# Patient Record
Sex: Female | Born: 1990 | Race: White | Hispanic: No | Marital: Married | Smoking: Never smoker
Health system: Southern US, Community
[De-identification: ages and names within clinical notes are randomized; demographics above are authoritative.]

## PROBLEM LIST (undated history)

## (undated) DIAGNOSIS — F329 Major depressive disorder, single episode, unspecified: Secondary | ICD-10-CM

## (undated) DIAGNOSIS — F32A Depression, unspecified: Secondary | ICD-10-CM

## (undated) DIAGNOSIS — J45909 Unspecified asthma, uncomplicated: Secondary | ICD-10-CM

## (undated) DIAGNOSIS — Z8719 Personal history of other diseases of the digestive system: Secondary | ICD-10-CM

## (undated) DIAGNOSIS — F419 Anxiety disorder, unspecified: Secondary | ICD-10-CM

## (undated) HISTORY — DX: Unspecified asthma, uncomplicated: J45.909

## (undated) HISTORY — PX: DIAGNOSTIC LAPAROSCOPY: SUR761

## (undated) HISTORY — PX: GANGLION CYST EXCISION: SHX1691

---
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
## 2014-10-21 ENCOUNTER — Encounter (HOSPITAL_COMMUNITY): Payer: Self-pay

## 2014-10-21 ENCOUNTER — Emergency Department (HOSPITAL_COMMUNITY)
Admission: EM | Admit: 2014-10-21 | Discharge: 2014-10-21 | Disposition: A | Discharge status: Home / Self Care | Payer: No Typology Code available for payment source | Attending: Emergency Medicine | Admitting: Emergency Medicine

## 2014-10-21 ENCOUNTER — Emergency Department (HOSPITAL_COMMUNITY): Payer: No Typology Code available for payment source

## 2014-10-21 DIAGNOSIS — S199XXA Unspecified injury of neck, initial encounter: Secondary | ICD-10-CM | POA: Insufficient documentation

## 2014-10-21 DIAGNOSIS — S32058A Other fracture of fifth lumbar vertebra, initial encounter for closed fracture: Secondary | ICD-10-CM | POA: Insufficient documentation

## 2014-10-21 DIAGNOSIS — S3992XA Unspecified injury of lower back, initial encounter: Secondary | ICD-10-CM | POA: Diagnosis present

## 2014-10-21 DIAGNOSIS — S32009A Unspecified fracture of unspecified lumbar vertebra, initial encounter for closed fracture: Secondary | ICD-10-CM

## 2014-10-21 DIAGNOSIS — S32018A Other fracture of first lumbar vertebra, initial encounter for closed fracture: Secondary | ICD-10-CM | POA: Insufficient documentation

## 2014-10-21 DIAGNOSIS — S32048A Other fracture of fourth lumbar vertebra, initial encounter for closed fracture: Secondary | ICD-10-CM | POA: Insufficient documentation

## 2014-10-21 DIAGNOSIS — Y9389 Activity, other specified: Secondary | ICD-10-CM | POA: Diagnosis not present

## 2014-10-21 DIAGNOSIS — Y92410 Unspecified street and highway as the place of occurrence of the external cause: Secondary | ICD-10-CM | POA: Diagnosis not present

## 2014-10-21 DIAGNOSIS — S0990XA Unspecified injury of head, initial encounter: Secondary | ICD-10-CM | POA: Insufficient documentation

## 2014-10-21 DIAGNOSIS — S32038A Other fracture of third lumbar vertebra, initial encounter for closed fracture: Secondary | ICD-10-CM | POA: Diagnosis not present

## 2014-10-21 DIAGNOSIS — Y998 Other external cause status: Secondary | ICD-10-CM | POA: Diagnosis not present

## 2014-10-21 LAB — POC URINE PREG, ED: PREG TEST UR: NEGATIVE

## 2014-10-21 MED ORDER — CYCLOBENZAPRINE HCL 10 MG PO TABS: 10.0000 mg | ORAL_TABLET | Freq: Three times a day (TID) | ORAL | Status: DC | PRN

## 2014-10-21 MED ORDER — TRAMADOL HCL 50 MG PO TABS: 50.0000 mg | ORAL_TABLET | Freq: Four times a day (QID) | ORAL | Status: DC | PRN
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
## 2014-10-21 NOTE — Discharge Instructions (Signed)
Transverse Process Fracture °A fracture of a bone is the same as a break in the bone. A fracture of a transverse process is a break of a part of one of the bones in the spine. This part extends out from the side of the main body of the bone (called the vertebral body). A transverse process is shaped like a wing. They extend from both the left and right sides of the vertebral body. °Many of these injuries occur in the thoracic spine (the upper and middle parts of the back) and the lumbar spine (the low back area). In the elderly, these injuries can also occur in the lower neck area and are affected by age-related arthritis problems and osteoporosis (thinning of bone). °It takes a lot of force to cause this type of fracture. Because other organs and so many other parts of the spine are close to the transverse processes, these fractures usually occur at the same time as injuries to: °· Other bones. °· Organs. °· Possibly the spinal cord. °Even if there is only a break to one transverse process, your caregiver will take steps to make sure that a nearby organ has not also been injured.  °CAUSES  °Most of these injuries occur as a result of a variety of accidents such as: °· Falls. °· Motor vehicle accidents. °· Recreational activities. °· A smaller number occur due to: °¨ Industrial, agricultural, and aviation accidents. °¨ Gunshot wounds and direct blows to the back. °¨ Parachuting incidents. °SYMPTOMS  °Patients with transverse process fractures have severe pain even if the actual break is small or limited and there is no injury to nearby bones, organs, or the spinal cord. More severe or complex injuries involving other bones and/or organs may include: °· Deformity of the back bones. °· Swelling/bruising over the injured area. °· Limited ability to move the affected area. °· There may be injury to a nearby: °¨ Lung. °¨ Kidney. °¨ Spleen. °¨ Liver. °Injury to nearby nerves can cause partial or complete loss of function  of the bladder and/or bowels. More severe injuries can also cause: °· Loss of sensation and/or strength in the arms/hands (if the break is in the lower part of the neck), legs, feet, and toes. °· Spinal cord injury that leads to paralysis. °DIAGNOSIS °In most cases, a broken bone will be suspected by what happened just prior to the onset of back and/or neck pain. X-rays and special imaging (CT scan and MRI imaging) are used to confirm the diagnosis as well as finding out the type and severity of the break or breaks. These tests are important for guiding and planning treatment. °There are times when special imaging cannot be done. MRI cannot be done if there is an implanted metallic device (such as a pacemaker). In these cases, other tests and imaging are done. °Other tests may be done if your caregiver is concerned about injuries to internal organs near the break of the transverse process. For example, an ultrasound may be ordered to examine the liver, spleen, or kidneys. °If there has been nerve damage near the break, additional tests may be ordered in order to find out: °· Exactly how much damage has occurred. °· To plan for what can be done to help. °These include: °· Tests of nerve function through muscles (nerve conduction studies and electromyography). °· Tests of bladder function (urodynamics). °· Tests that focus on defining specific nerve problems before surgery and what improvement has come about after surgery (evoked potentials). °TREATMENT °  If the injury is limited to a break of a transverse process with no other bone or organ injury, hospital care may not be necessary. Medication for pain control, special back bracing, and limitations in activity are done first followed by physical therapy later. °Complex breaks, multiple fractures of the spine, or unstable injuries can damage the spinal cord and may require an operation to remove pressure from the nerves and/or spinal cord and to stabilize the broken  pieces of bone. Specialty care may be needed if there has been injury to an internal organ near a broken transverse process. Each individual set of injuries is unique, and your caregiver will review many things when planning the best approach that will give the highest likelihood of a good outcome.  °HOME CARE INSTRUCTIONS °There is pain and stiffness in the back for weeks after a transverse process fracture. Bed rest, pain medicine, and a slow return to activity are generally recommended. Neck and back braces may be helpful in reducing pain and increasing mobility. When your pain allows, simple walking will help to begin the process of returning to normal activities. Exercises to improve motion and to strengthen the back may also be useful after the initial pain goes away. This will be guided by your caregiver and the team (nurses, physical therapists, occupational therapists, etc.) involved with your ongoing care. For the elderly, treatment for osteoporosis may be essential to help reduce your risk of fractures in the future. °Arrange for follow-up care as recommended to assure proper long-term care and prevention of further spine injury. It is important that you participate in your return to good health. The failure to follow up as recommended with your caregiver, orthopedic referral or other provider may result in the bone not healing properly with possible chronic disability or pain. °SEEK MEDICAL CARE IF: °· Pain is not effectively controlled with medication. °· You feel unable to decrease pain medication over time as planned. °· Activity level is not improving as planned and/or expected. °SEEK IMMEDIATE MEDICAL CARE IF: °· You have increasing pain, vomiting, or are unable to move around at all. °· You have numbness, tingling, weakness, or paralysis of any part of your body. °· You have loss of normal bowel or bladder control. °· You have difficulty breathing, cough, fever, chest or abdominal pain. °MAKE SURE  YOU:  °· Understand these instructions. °· Will watch your condition. °· Will get help right away if you are not doing well or get worse. °Document Released: 04/26/2006 Document Revised: 05/26/2013 Document Reviewed: 12/25/2006 °[employer]® Patient Information ©2015 [employer]. This information is not intended to replace advice given to you by your health care provider. Make sure you discuss any questions you have with your health care provider. ° °

## 2016-11-09 LAB — OB RESULTS CONSOLE GBS: STREP GROUP B AG: POSITIVE

## 2016-11-09 LAB — OB RESULTS CONSOLE RUBELLA ANTIBODY, IGM: Rubella: IMMUNE

## 2016-11-09 LAB — OB RESULTS CONSOLE GC/CHLAMYDIA
CHLAMYDIA, DNA PROBE: NEGATIVE
Gonorrhea: NEGATIVE

## 2016-11-09 LAB — OB RESULTS CONSOLE ABO/RH: RH TYPE: POSITIVE

## 2016-11-09 LAB — OB RESULTS CONSOLE RPR: RPR: NONREACTIVE

## 2016-11-09 LAB — OB RESULTS CONSOLE HEPATITIS B SURFACE ANTIGEN: HEP B S AG: NEGATIVE

## 2016-11-09 LAB — OB RESULTS CONSOLE ANTIBODY SCREEN: Antibody Screen: NEGATIVE

## 2016-11-09 LAB — OB RESULTS CONSOLE HIV ANTIBODY (ROUTINE TESTING): HIV: NONREACTIVE

## 2017-01-23 NOTE — L&D Delivery Note (Signed)
Delivery Note  SVD viable female Apgars 9,9 over 2nd degree ML lac.  Placenta delivered spontaneously intact with 3VC. Repair with 2-0 Chromic with good support and hemostasis noted.  R/V exam confirms.  PH art was sent.   Mother and baby to couplet care and are doing well.  EBL 400cc  Candice Camp, MD

## 2017-06-07 ENCOUNTER — Telehealth (HOSPITAL_COMMUNITY): Payer: Self-pay | Admitting: *Deleted

## 2017-06-07 ENCOUNTER — Encounter (HOSPITAL_COMMUNITY): Payer: Self-pay | Admitting: *Deleted

## 2017-06-07 NOTE — Telephone Encounter (Signed)
Preadmission screen  

## 2017-06-19 ENCOUNTER — Other Ambulatory Visit: Payer: Self-pay

## 2017-06-19 ENCOUNTER — Inpatient Hospital Stay (HOSPITAL_COMMUNITY)
Admission: AD | Admit: 2017-06-19 | Discharge: 2017-06-21 | DRG: 807 | Disposition: A | Discharge status: Home / Self Care | Payer: BLUE CROSS/BLUE SHIELD | Source: Ambulatory Visit | Attending: Obstetrics and Gynecology | Admitting: Obstetrics and Gynecology

## 2017-06-19 ENCOUNTER — Encounter (HOSPITAL_COMMUNITY): Payer: Self-pay

## 2017-06-19 DIAGNOSIS — O99824 Streptococcus B carrier state complicating childbirth: Principal | ICD-10-CM | POA: Diagnosis present

## 2017-06-19 DIAGNOSIS — Z3A39 39 weeks gestation of pregnancy: Secondary | ICD-10-CM

## 2017-06-19 DIAGNOSIS — O99214 Obesity complicating childbirth: Secondary | ICD-10-CM | POA: Diagnosis present

## 2017-06-19 MED ORDER — LACTATED RINGERS IV SOLN
INTRAVENOUS | Status: DC
  Administered 2017-06-19 – 2017-06-20 (×2): via INTRAVENOUS

## 2017-06-19 MED ORDER — LACTATED RINGERS IV SOLN: 500.0000 mL | INTRAVENOUS | Status: DC | PRN

## 2017-06-19 NOTE — H&P (Signed)
Morgan Olson is a 27 y.o. female G1 @ 39+5 wks presenting for SOL.  Pt reports increase in contractions today.  No lof or vb.  Pregnancy uncomplicated.   OB History    Gravida  1   Para      Term      Preterm      AB      Living        SAB      TAB      Ectopic      Multiple      Live Births             Past Medical History:  Diagnosis Date  . Asthma    Past Surgical History:  Procedure Laterality Date  . GANGLION CYST EXCISION     Family History: family history includes Cancer in her maternal aunt and maternal grandmother; Diabetes in her maternal aunt and maternal grandmother; Heart disease in her maternal grandmother; Hypertension in her paternal grandmother. Social History:  reports that she has never smoked. She has never used smokeless tobacco. She reports that she does not drink alcohol or use drugs.     Maternal Diabetes: No Genetic Screening: Declined Maternal Ultrasounds/Referrals: Normal Fetal Ultrasounds or other Referrals:  None Maternal Substance Abuse:  No Significant Maternal Medications:  None Significant Maternal Lab Results:  None Other Comments:  None  ROS History Dilation: 4 Effacement (%): 90 Station: -3 Exam by:: Morgan Resides, RN [phone] Blood pressure 135/81, pulse 95, temperature 98.3 F (36.8 C), temperature source Oral, resp. rate (!) 22, weight 295 lb 0.1 oz (133.8 kg), SpO2 98 %. Exam Physical Exam  Gen - uncomfortable w/ contractions Abd - gravid, NT Ext - NT Cvx 4cm, changed from 1.5cm in office Prenatal labs: ABO, Rh: A/Positive/-- (10/18 0000) Antibody: Negative (10/18 0000) Rubella: Immune (10/18 0000) RPR: Nonreactive (10/18 0000)  HBsAg: Negative (10/18 0000)  HIV: Non-reactive (10/18 0000)  GBS: Positive (10/18 0000)   Assessment/Plan: Admit PCN Epidural prn   Morgan Olson 06/19/2017, 11:50 PM

## 2017-06-19 NOTE — MAU Note (Signed)
Pt states that she started having ctx's at 0900. She states that they have worsened throughout the day and are closer together.   Reports good fetal movement.  Denies vaginal bleeding, Denies LOF

## 2017-06-20 ENCOUNTER — Inpatient Hospital Stay (HOSPITAL_COMMUNITY): Payer: BLUE CROSS/BLUE SHIELD | Admitting: Anesthesiology

## 2017-06-20 ENCOUNTER — Other Ambulatory Visit: Payer: Self-pay

## 2017-06-20 ENCOUNTER — Encounter (HOSPITAL_COMMUNITY): Payer: Self-pay | Admitting: Emergency Medicine

## 2017-06-20 DIAGNOSIS — Z3483 Encounter for supervision of other normal pregnancy, third trimester: Secondary | ICD-10-CM | POA: Diagnosis present

## 2017-06-20 DIAGNOSIS — Z3A39 39 weeks gestation of pregnancy: Secondary | ICD-10-CM | POA: Diagnosis not present

## 2017-06-20 DIAGNOSIS — O99824 Streptococcus B carrier state complicating childbirth: Secondary | ICD-10-CM | POA: Diagnosis present

## 2017-06-20 DIAGNOSIS — O99214 Obesity complicating childbirth: Secondary | ICD-10-CM | POA: Diagnosis present

## 2017-06-20 LAB — CBC
HEMATOCRIT: 39.7 % (ref 36.0–46.0)
HEMOGLOBIN: 13.3 g/dL (ref 12.0–15.0)
MCH: 29.2 pg (ref 26.0–34.0)
MCHC: 33.5 g/dL (ref 30.0–36.0)
MCV: 87.3 fL (ref 78.0–100.0)
Platelets: 202 10*3/uL (ref 150–400)
RBC: 4.55 MIL/uL (ref 3.87–5.11)
RDW: 14.2 % (ref 11.5–15.5)
WBC: 11.7 10*3/uL — AB (ref 4.0–10.5)

## 2017-06-20 LAB — ABO/RH: ABO/RH(D): A POS

## 2017-06-20 LAB — TYPE AND SCREEN
ABO/RH(D): A POS
ANTIBODY SCREEN: NEGATIVE

## 2017-06-20 LAB — RPR: RPR Ser Ql: NONREACTIVE

## 2017-06-20 MED ORDER — OXYCODONE-ACETAMINOPHEN 5-325 MG PO TABS: 1.0000 | ORAL_TABLET | ORAL | Status: DC | PRN

## 2017-06-20 MED ORDER — MEASLES, MUMPS & RUBELLA VAC ~~LOC~~ INJ
0.5000 mL | INJECTION | Freq: Once | SUBCUTANEOUS | Status: DC
  Filled 2017-06-20: qty 0.5

## 2017-06-20 MED ORDER — DIPHENHYDRAMINE HCL 25 MG PO CAPS
25.0000 mg | ORAL_CAPSULE | Freq: Four times a day (QID) | ORAL | Status: DC | PRN
Start: 2017-06-20 — End: 2017-06-21

## 2017-06-20 MED ORDER — ONDANSETRON HCL 4 MG/2ML IJ SOLN: 4.0000 mg | INTRAMUSCULAR | Status: DC | PRN

## 2017-06-20 MED ORDER — DIPHENHYDRAMINE HCL 50 MG/ML IJ SOLN: 12.5000 mg | INTRAMUSCULAR | Status: DC | PRN

## 2017-06-20 MED ORDER — PHENYLEPHRINE 40 MCG/ML (10ML) SYRINGE FOR IV PUSH (FOR BLOOD PRESSURE SUPPORT)
80.0000 ug | PREFILLED_SYRINGE | INTRAVENOUS | Status: DC | PRN
  Filled 2017-06-20: qty 10
  Filled 2017-06-20: qty 5

## 2017-06-20 MED ORDER — OXYCODONE-ACETAMINOPHEN 5-325 MG PO TABS: 2.0000 | ORAL_TABLET | ORAL | Status: DC | PRN

## 2017-06-20 MED ORDER — PENICILLIN G POT IN DEXTROSE 60000 UNIT/ML IV SOLN
3.0000 10*6.[IU] | INTRAVENOUS | Status: DC
  Administered 2017-06-20: 3 10*6.[IU] via INTRAVENOUS
  Filled 2017-06-20 (×3): qty 50

## 2017-06-20 MED ORDER — WITCH HAZEL-GLYCERIN EX PADS: 1.0000 "application " | MEDICATED_PAD | CUTANEOUS | Status: DC | PRN

## 2017-06-20 MED ORDER — LIDOCAINE HCL (PF) 1 % IJ SOLN
INTRAMUSCULAR | Status: DC | PRN
  Administered 2017-06-20: 8 mL

## 2017-06-20 MED ORDER — LACTATED RINGERS IV SOLN
500.0000 mL | Freq: Once | INTRAVENOUS | Status: AC
  Administered 2017-06-20: 500 mL via INTRAVENOUS

## 2017-06-20 MED ORDER — SOD CITRATE-CITRIC ACID 500-334 MG/5ML PO SOLN: 30.0000 mL | ORAL | Status: DC | PRN

## 2017-06-20 MED ORDER — ACETAMINOPHEN 325 MG PO TABS: 650.0000 mg | ORAL_TABLET | ORAL | Status: DC | PRN

## 2017-06-20 MED ORDER — EPHEDRINE 5 MG/ML INJ
10.0000 mg | INTRAVENOUS | Status: DC | PRN
  Filled 2017-06-20: qty 2

## 2017-06-20 MED ORDER — MEDROXYPROGESTERONE ACETATE 150 MG/ML IM SUSP: 150.0000 mg | INTRAMUSCULAR | Status: DC | PRN

## 2017-06-20 MED ORDER — COCONUT OIL OIL: 1.0000 "application " | TOPICAL_OIL | Status: DC | PRN

## 2017-06-20 MED ORDER — IBUPROFEN 600 MG PO TABS
600.0000 mg | ORAL_TABLET | Freq: Four times a day (QID) | ORAL | Status: DC
  Administered 2017-06-20 – 2017-06-21 (×5): 600 mg via ORAL
  Filled 2017-06-20 (×4): qty 1

## 2017-06-20 MED ORDER — DIBUCAINE 1 % RE OINT: 1.0000 "application " | TOPICAL_OINTMENT | RECTAL | Status: DC | PRN

## 2017-06-20 MED ORDER — ONDANSETRON HCL 4 MG PO TABS: 4.0000 mg | ORAL_TABLET | ORAL | Status: DC | PRN

## 2017-06-20 MED ORDER — SENNOSIDES-DOCUSATE SODIUM 8.6-50 MG PO TABS
2.0000 | ORAL_TABLET | ORAL | Status: DC
  Administered 2017-06-20: 2 via ORAL
  Filled 2017-06-20: qty 2

## 2017-06-20 MED ORDER — SODIUM CHLORIDE 0.9 % IV SOLN
5.0000 10*6.[IU] | Freq: Once | INTRAVENOUS | Status: AC
  Administered 2017-06-20: 5 10*6.[IU] via INTRAVENOUS
  Filled 2017-06-20: qty 5

## 2017-06-20 MED ORDER — OXYTOCIN BOLUS FROM INFUSION
500.0000 mL | Freq: Once | INTRAVENOUS | Status: AC
  Administered 2017-06-20: 500 mL via INTRAVENOUS

## 2017-06-20 MED ORDER — LIDOCAINE HCL (PF) 1 % IJ SOLN
30.0000 mL | INTRAMUSCULAR | Status: DC | PRN
  Filled 2017-06-20: qty 30

## 2017-06-20 MED ORDER — FENTANYL 2.5 MCG/ML BUPIVACAINE 1/10 % EPIDURAL INFUSION (WH - ANES)
14.0000 mL/h | INTRAMUSCULAR | Status: DC | PRN
  Administered 2017-06-20: 14 mL/h via EPIDURAL
  Filled 2017-06-20: qty 100

## 2017-06-20 MED ORDER — PRENATAL MULTIVITAMIN CH
1.0000 | ORAL_TABLET | Freq: Every day | ORAL | Status: DC
  Administered 2017-06-20 – 2017-06-21 (×2): 1 via ORAL
  Filled 2017-06-20: qty 1

## 2017-06-20 MED ORDER — LIDOCAINE HCL (PF) 1 % IJ SOLN: INTRAMUSCULAR | Status: DC | PRN

## 2017-06-20 MED ORDER — BENZOCAINE-MENTHOL 20-0.5 % EX AERO
1.0000 "application " | INHALATION_SPRAY | CUTANEOUS | Status: DC | PRN
  Administered 2017-06-20: 1 via TOPICAL
  Filled 2017-06-20 (×2): qty 56

## 2017-06-20 MED ORDER — OXYTOCIN 40 UNITS IN LACTATED RINGERS INFUSION - SIMPLE MED
2.5000 [IU]/h | INTRAVENOUS | Status: DC
  Filled 2017-06-20: qty 1000

## 2017-06-20 MED ORDER — ONDANSETRON HCL 4 MG/2ML IJ SOLN: 4.0000 mg | Freq: Four times a day (QID) | INTRAMUSCULAR | Status: DC | PRN

## 2017-06-20 MED ORDER — ZOLPIDEM TARTRATE 5 MG PO TABS
5.0000 mg | ORAL_TABLET | Freq: Every evening | ORAL | Status: DC | PRN
Start: 2017-06-20 — End: 2017-06-21

## 2017-06-20 MED ORDER — TETANUS-DIPHTH-ACELL PERTUSSIS 5-2.5-18.5 LF-MCG/0.5 IM SUSP: 0.5000 mL | Freq: Once | INTRAMUSCULAR | Status: DC

## 2017-06-20 MED ORDER — SIMETHICONE 80 MG PO CHEW: 80.0000 mg | CHEWABLE_TABLET | ORAL | Status: DC | PRN

## 2017-06-20 NOTE — Anesthesia Preprocedure Evaluation (Signed)
Anesthesia Evaluation  Patient identified by MRN, date of birth, ID band Patient awake    Reviewed: Allergy & Precautions, H&P , NPO status , Patient's Chart, lab work & pertinent test results, reviewed documented beta blocker date and time   Airway Mallampati: I  TM Distance: >3 FB Neck ROM: full    Dental no notable dental hx.    Pulmonary neg pulmonary ROS,    Pulmonary exam normal breath sounds clear to auscultation       Cardiovascular negative cardio ROS Normal cardiovascular exam Rhythm:regular Rate:Normal     Neuro/Psych negative neurological ROS  negative psych ROS   GI/Hepatic negative GI ROS, Neg liver ROS,   Endo/Other  Morbid obesity  Renal/GU negative Renal ROS  negative genitourinary   Musculoskeletal   Abdominal   Peds  Hematology negative hematology ROS (+)   Anesthesia Other Findings   Reproductive/Obstetrics (+) Pregnancy                             Anesthesia Physical Anesthesia Plan  ASA: III  Anesthesia Plan: Epidural   Post-op Pain Management:    Induction:   PONV Risk Score and Plan:   Airway Management Planned:   Additional Equipment:   Intra-op Plan:   Post-operative Plan:   Informed Consent: I have reviewed the patients History and Physical, chart, labs and discussed the procedure including the risks, benefits and alternatives for the proposed anesthesia with the patient or authorized representative who has indicated his/her understanding and acceptance.     Plan Discussed with:   Anesthesia Plan Comments:         Anesthesia Quick Evaluation

## 2017-06-20 NOTE — Progress Notes (Signed)
Patient ID: Morgan Olson, female   DOB: 02-09-1990, 27 y.o.   MRN: [id number] Pt comfortable with epidural VSSAF FHR 140s + accels Ctxs q 3  Cx c/c/+2  IUP at term 2nd stage.  Anticipate SVD

## 2017-06-20 NOTE — Anesthesia Postprocedure Evaluation (Signed)
Anesthesia Post Note  Patient: Morgan Olson  Procedure(s) Performed: AN AD HOC LABOR EPIDURAL     Patient location during evaluation: Mother Baby Anesthesia Type: Epidural Level of consciousness: awake and alert and oriented Pain management: satisfactory to patient Vital Signs Assessment: post-procedure vital signs reviewed and stable Respiratory status: spontaneous breathing and nonlabored ventilation Cardiovascular status: stable Postop Assessment: no headache, no backache, no signs of nausea or vomiting, adequate PO intake and patient able to bend at knees (patient up walking) Anesthetic complications: no    Last Vitals:  Vitals:   06/20/17 1149 06/20/17 1536  BP: 120/83 114/74  Pulse: 90 95  Resp: 18 20  Temp: 36.7 C 36.7 C  SpO2:      Last Pain:  Vitals:   06/20/17 1725  TempSrc:   PainSc: 3    Pain Goal:                 Madison Hickman

## 2017-06-20 NOTE — Anesthesia Procedure Notes (Signed)
Epidural Patient location during procedure: OB Start time: 06/20/2017 3:15 AM End time: 06/20/2017 3:20 AM  Staffing Anesthesiologist: Bethena Midget, MD  Preanesthetic Checklist Completed: patient identified, site marked, surgical consent, pre-op evaluation, timeout performed, IV checked, risks and benefits discussed and monitors and equipment checked  Epidural Patient position: sitting Prep: site prepped and draped and DuraPrep Patient monitoring: continuous pulse ox and blood pressure Approach: midline Location: L4-L5 Injection technique: LOR air  Needle:  Needle type: Tuohy  Needle gauge: 17 G Needle length: 9 cm and 9 Needle insertion depth: 9 cm Catheter type: closed end flexible Catheter size: 19 Gauge Catheter at skin depth: 15 cm Test dose: negative  Assessment Events: blood not aspirated, injection not painful, no injection resistance, negative IV test and no paresthesia

## 2017-06-20 NOTE — Lactation Note (Addendum)
This note was copied from a baby's chart. Lactation Consultation Note  Patient Name: Morgan Olson [id number] Date: 06/20/2017 Reason for consult: Initial assessment;1st time breastfeeding;Primapara;Term;Difficult latch   P1 mother whose infant is now 19 hours old.    Mother stated that she wishes to pump and bottle feed infant.  After talking with her she decided to breastfeed while in the hospital and then start pumping at home so baby can obtain colostrum here. It seems quite evident that she is not real happy with her breastfeeding success so far. She stated that she just finished feeding baby.  However, infant is fussy and showing feeding cues.  I offered to assist and she accepted.  Mother's breasts are soft and non tender.  She has short shafted nipples bilaterally.  I assisted baby to latch in the football hold on the right breast.  She had a wide open mouth and was able to maintain her latch for a few good sucks.  However, it was easy for her to lose her latch due to the short nipple.  I repeatedly showed mother how to assist getting a deep latch and baby latched multiple times.  Baby fed approximately 10 minutes on the right breast and fell asleep.  I offered breast shells and a manual pump but she refused.  Reviewed feeding 8-12 times/24 hours or more if baby shows feeding cues.  Continue STS and breast massage with hand expression.  Since mother wants to begin pumping tomorrow when she goes home I offered to initiate a DEBP here to help increase milk supply and also to help evert her nipples.  She gladly accepted the offer to pump.  Flange size #24 is appropriate at this time.  Reviewed pump settings and cleaning of supplies.  Colostrum container left with instructions for collecting and storing milk.    Mother has a DEBP for home use.  She will call for assistance as needed.  Mom made aware of O/P services, breastfeeding support groups, community resources, and our phone # for  post-discharge questions. FOB present and supportive.   Maternal Data Formula Feeding for Exclusion: No Has patient been taught Hand Expression?: Yes Does the patient have breastfeeding experience prior to this delivery?: No  Feeding Feeding Type: Breast Fed Length of feed: 10 min  LATCH Score Latch: Repeated attempts needed to sustain latch, nipple held in mouth throughout feeding, stimulation needed to elicit sucking reflex.  Audible Swallowing: A few with stimulation  Type of Nipple: Everted at rest and after stimulation(short shafted bilaterally)  Comfort (Breast/Nipple): Soft / non-tender  Hold (Positioning): Assistance needed to correctly position infant at breast and maintain latch.  LATCH Score: 7  Interventions Interventions: Breast feeding basics reviewed;Assisted with latch;Skin to skin;Breast massage;Hand express;Position options;Support pillows;Adjust position;Breast compression;DEBP  Lactation Tools Discussed/Used Pump Review: Setup, frequency, and cleaning;Milk Storage Initiated by:: Morgan Olson Date initiated:: 06/21/17   Consult Status Consult Status: Follow-up Date: 06/21/17 Follow-up type: In-patient    [name] R Antoine Fiallos 06/20/2017, 10:59 PM

## 2017-06-21 ENCOUNTER — Inpatient Hospital Stay (HOSPITAL_COMMUNITY): Admission: RE | Admit: 2017-06-21 | Payer: BLUE CROSS/BLUE SHIELD | Source: Ambulatory Visit

## 2017-06-21 LAB — CBC
HCT: 35.3 % — ABNORMAL LOW (ref 36.0–46.0)
Hemoglobin: 11.6 g/dL — ABNORMAL LOW (ref 12.0–15.0)
MCH: 29.1 pg (ref 26.0–34.0)
MCHC: 32.9 g/dL (ref 30.0–36.0)
MCV: 88.5 fL (ref 78.0–100.0)
PLATELETS: 195 10*3/uL (ref 150–400)
RBC: 3.99 MIL/uL (ref 3.87–5.11)
RDW: 14.5 % (ref 11.5–15.5)
WBC: 9 10*3/uL (ref 4.0–10.5)

## 2017-06-21 NOTE — Discharge Summary (Signed)
Obstetric Discharge Summary Reason for Admission: onset of labor Prenatal Procedures: none Intrapartum Procedures: spontaneous vaginal delivery Postpartum Procedures: none Complications-Operative and Postpartum: 2nd degree perineal laceration Hemoglobin  Date Value Ref Range Status  06/21/2017 11.6 (L) 12.0 - 15.0 g/dL Final   HCT  Date Value Ref Range Status  06/21/2017 35.3 (L) 36.0 - 46.0 % Final    Physical Exam:  General: alert, cooperative and appears stated age 27: appropriate Uterine Fundus: firm Incision: healing well, no significant drainage, no dehiscence DVT Evaluation: No evidence of DVT seen on physical exam.  Discharge Diagnoses: Term Pregnancy-delivered  Discharge Information: Date: 06/21/2017 Activity: pelvic rest Diet: routine Medications: None Condition: improved Instructions: refer to practice specific booklet Discharge to: home   Newborn Data: Live born female  Birth Weight: 7 lb 0.9 oz (3200 g) APGAR: 9, 9  Newborn Delivery   Birth date/time:  06/20/2017 08:32:00 Delivery type:  Vaginal, Spontaneous     Home with mother.  Ghina Bittinger L 06/21/2017, 8:06 AM

## 2017-06-21 NOTE — Lactation Note (Signed)
This note was copied from a baby's chart. Lactation Consultation Note  Patient Name: Morgan Olson [id number] Date: 06/21/2017 Reason for consult: Follow-up assessment Mom requesting assist with latch on right breast.  Nipple inverts slightly with compression. Baby latches with shallow latch and has difficulty sustaining latch.  20 mm nipple shield applied and baby did latch deeper.  Instructed to post pump every 3 hours.  Lactation outpatient services and support information reviewed and encouraged prn.  Maternal Data    Feeding Feeding Type: Breast Fed Length of feed: 10 min  LATCH Score Latch: Grasps breast easily, tongue down, lips flanged, rhythmical sucking.  Audible Swallowing: A few with stimulation  Type of Nipple: Flat  Comfort (Breast/Nipple): Soft / non-tender  Hold (Positioning): Assistance needed to correctly position infant at breast and maintain latch.  LATCH Score: 7  Interventions Interventions: Assisted with latch;Breast compression;Skin to skin;Adjust position;Breast massage;Support pillows;Hand express;Position options  Lactation Tools Discussed/Used Tools: Nipple Shields Nipple shield size: 20   Consult Status Consult Status: Complete Follow-up type: Call as needed    Morgan Olson, Morgan Olson 06/21/2017, 3:03 PM

## 2017-11-27 ENCOUNTER — Other Ambulatory Visit (HOSPITAL_COMMUNITY): Payer: Self-pay | Admitting: General Surgery

## 2017-12-14 ENCOUNTER — Encounter: Payer: Self-pay | Admitting: Dietician

## 2017-12-14 ENCOUNTER — Encounter: Payer: BLUE CROSS/BLUE SHIELD | Attending: General Surgery | Admitting: Dietician

## 2017-12-14 ENCOUNTER — Other Ambulatory Visit (HOSPITAL_COMMUNITY): Payer: Self-pay | Admitting: General Surgery

## 2017-12-14 VITALS — Ht 62.0 in | Wt 287.5 lb

## 2017-12-14 DIAGNOSIS — Z713 Dietary counseling and surveillance: Secondary | ICD-10-CM | POA: Insufficient documentation

## 2017-12-14 DIAGNOSIS — E669 Obesity, unspecified: Secondary | ICD-10-CM

## 2017-12-14 NOTE — Progress Notes (Signed)
Bariatric Pre-Op Nutrition Assessment for Planned Gastric Sleeve Surgery Medical Nutrition Therapy  Appt Start Time: 7:30am  End time: 8:30am  Patient was seen on 12/14/2017 for Pre-Operative Nutrition Assessment. Assessment and letter of approval faxed to [facility] Bariatric Surgery Program coordinator on 12/14/2017.   Pt expectation of surgery: Weight loss to help relieve back pain and feel better. Pt expectation of dietitian: Dietary guidance after surgery.   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 Pt states she lives with her husband and their 8-month-old daughter. Pt is very kind and is motivated for surgery.   24-Hr Dietary Recall First Meal: D[employer] Fit greek yogurt or brown sugar oatmeal + peanut butter  Snack: [place] pepperonis + cheese stick Second Meal: cheeseburger or chicken nuggets + fries  Snack: Premier Protein shake  Third Meal: spaghetti or tacos Snack: ice cream or cookies Beverages: coffee + water + Insurance claims handler +V8 energy drinks  Food & Nutrition Related Hx Dietary Hx: Pt states she likes carbs/sugar and is always tempted to eat these foods if they are available or in the house. Pt states she always eats dessert. Pt states she does not prefer meat but will eat chicken. Pt states she does not drink sodas or alcohol. Common foods she and her family eat include spaghetti, tacos, Cookout, [employer]'s, fries, dessert. Pt states her mother cares for her 31-month-old throughout the week so they will eat dinner at her mother's house 2x's/week, which is typically something "unhealthy." Pt states that her parents provided abundantly for her (in the context of food) which contributed to her hx of overconsumption of food. Pt states she has been overweight since  about the 6th grade. Pt states she never skips meals, especially breakfast.   Estimated Daily Fluid Intake: 48 oz water + coffee  GI / Other Notable Symptoms: none   Physical Activity  Current average weekly physical activity: none   Estimated Energy Needs Calories: 2000 Carbohydrate: 225g Protein: 150g Fat: 55g  Pre-Op Goals Reviewed with the Patient . Track your food and beverages using MyFitness Pal or the [employer] app . Make healthy food choices . Avoid concentrated sugars and fried foods . Keep fat & sugar in the single digits per serving on food labels . Practice CHEWING your food (aim for applesauce consistency) . Practice not drinking 15 minutes before, during, and 30 minutes after each meal/snack . Avoid all carbonated beverages (ex: soda, sparkling beverages)  . Limit caffeinated beverages (ex: coffee, tea, energy drinks) . Avoid all sugar-sweetened beverages (ex: regular soda, sports drinks)  . Avoid alcohol  . Consume 3 meals per day or try to eat every 3-5 hours . Make a list of non-food related activities . Aim for 64-100 ounces of fluid daily (with at least half of fluid intake being plain water)  . Aim for at least 60-80 grams of protein daily . Look for a liquid protein source that contains ?15 g protein and ?5 g carbohydrate (ex: shakes, drinks, shots) . Physical activity is an important part of a healthy lifestyle so keep it moving!  Encouraged to engage in 75 minutes (with 150 minutes as the ultimate goal) of moderate physical activity including cardiovascular and weight baring weekly.   *Goals that are bolded indicate the pt would like  to start working towards these  Handouts Provided Include  . Pre-Op Goals . Bariatric Surgery Vitamins & Minerals . Bariatric Surgery Protein Shakes  Learning Style & Readiness for Change Teaching method utilized: Visual & Auditory  Demonstrated degree of understanding via: Teach Back  Barriers to learning/adherence to  lifestyle change: work-life balance, 4437-month-old baby, love for sugary/high carb foods  Next Steps Supervised Weight Loss (SWL) Visits Needed: 0  Patient is to call [facility] to enroll in Pre-Op Class (>2 weeks before surgery) and Post-Op Class (2 weeks after surgery) for further nutrition education when surgery date is scheduled.

## 2017-12-14 NOTE — Patient Instructions (Addendum)
Work through the goals listed on the Pre-Op Goals sheet, starting with:  . Avoid concentrated sugars and fried foods . Keep fat & sugar in the single digits per serving on food labels . Aim for 64-100 ounces of fluid daily (with at least half of fluid intake being plain water)   Call us at [phone] to schedule your Pre-Op Class, which must take place at least 2 weeks before surgery.

## 2017-12-19 ENCOUNTER — Ambulatory Visit (HOSPITAL_COMMUNITY)
Admission: RE | Admit: 2017-12-19 | Discharge: 2017-12-19 | Disposition: A | Discharge status: Home / Self Care | Payer: BLUE CROSS/BLUE SHIELD | Source: Ambulatory Visit | Attending: General Surgery | Admitting: General Surgery

## 2017-12-19 ENCOUNTER — Ambulatory Visit (HOSPITAL_COMMUNITY): Payer: BLUE CROSS/BLUE SHIELD

## 2017-12-31 ENCOUNTER — Other Ambulatory Visit (HOSPITAL_COMMUNITY): Payer: BLUE CROSS/BLUE SHIELD

## 2017-12-31 ENCOUNTER — Ambulatory Visit (HOSPITAL_COMMUNITY): Payer: BLUE CROSS/BLUE SHIELD

## 2018-02-11 ENCOUNTER — Encounter: Payer: BLUE CROSS/BLUE SHIELD | Attending: General Surgery | Admitting: Skilled Nursing Facility1

## 2018-02-11 DIAGNOSIS — E669 Obesity, unspecified: Secondary | ICD-10-CM

## 2018-02-11 DIAGNOSIS — Z713 Dietary counseling and surveillance: Secondary | ICD-10-CM | POA: Insufficient documentation

## 2018-02-12 ENCOUNTER — Encounter: Payer: Self-pay | Admitting: Skilled Nursing Facility1

## 2018-02-12 NOTE — Progress Notes (Signed)
Pre-Operative Nutrition Class:  Appt start time: 5681   End time:  1830.  Patient was seen on 02/11/2018 for Pre-Operative Bariatric Surgery [facility].   Surgery date:  Surgery type: sleeve Start weight at [facility]: 287.5 Weight today: 294.1  Samples given per MNT protocol. Patient educated on appropriate usage:  Bariatric Advantage Multivitamin Lot #[id number] Exp:04/20  Bariatric Advantage Calcium  Lot #[id number] Exp:may/03/2018  [place]jury Protein Shake Lot #[id number] Exp:April 22 2018  The following the learning objectives were met by the patient during this course:  Identify Pre-Op Dietary Goals and will begin 2 weeks pre-operatively  Identify appropriate sources of fluids and proteins   State protein recommendations and appropriate sources pre and post-operatively  Identify Post-Operative Dietary Goals and will follow for 2 weeks post-operatively  Identify appropriate multivitamin and calcium sources  Describe the need for physical activity post-operatively and will follow MD recommendations  State when to call healthcare provider regarding medication questions or post-operative complications  Handouts given during class include:  Pre-Op Bariatric Surgery Diet Handout  Protein Shake Handout  Post-Op Bariatric Surgery Nutrition Handout  [facility] Program Information Flyer  Support Group Information Flyer  [facility] Bariatric Supplements Price List  Follow-Up Plan: Patient will follow-up at [facility] 2 weeks post operatively for diet advancement per MD.

## 2018-06-05 ENCOUNTER — Ambulatory Visit: Payer: Self-pay | Admitting: General Surgery

## 2018-06-06 ENCOUNTER — Telehealth: Payer: Self-pay | Admitting: Skilled Nursing Facility1

## 2018-06-06 NOTE — Telephone Encounter (Signed)
Dietitian called pt to assess their understanding of the pre-op nutrition recommendations through the teach back method to ensure the pts knowledge readiness in preparation for surgery.    LVM 

## 2018-06-06 NOTE — Telephone Encounter (Signed)
Dietitian called pt to assess their understanding of the pre-op nutrition recommendations through the teach back method to ensure the pts knowledge readiness in preparation for surgery.    Pt asked if she should be taking the appropriate multivitamin before surgery: Dietitian advised pt to purchase them and start taking them after surgery.

## 2018-07-03 NOTE — Patient Instructions (Signed)
Morgan Olson    Your procedure is scheduled on: 07-08-2018  Report to Morgan Olson  Report to admitting at 1050 AM   YOU NEED TO HAVE A COVID 19 TEST ON_______ @_______ , THIS TEST MUST BE DONE BEFORE SURGERY, [facility].    Call this number if you have problems the morning of surgery [phone]    Remember: . BRUSH YOUR TEETH MORNING OF SURGERY AND RINSE YOUR MOUTH OUT, NO CHEWING GUM CANDY OR MINTS.   MORNING OF SURGERY DRINK:   DRINK 1 G2 drink BEFORE YOU LEAVE HOME, DRINK ALL OF THE  G2 DRINK AT ONE TIME.   NO SOLID FOOD AFTER 600 PM THE NIGHT BEFORE YOUR SURGERY. YOU MAY DRINK CLEAR FLUIDS. THE G2 DRINK YOU DRINK BEFORE YOU LEAVE HOME WILL BE THE LAST FLUIDS YOU DRINK BEFORE SURGERY DRINK G2 AT 950 AM.  PAIN IS EXPECTED AFTER SURGERY AND WILL NOT BE COMPLETELY ELIMINATED. AMBULATION AND TYLENOL WILL HELP REDUCE INCISIONAL AND GAS PAIN. MOVEMENT IS KEY!  YOU ARE EXPECTED TO BE OUT OF BED WITHIN 4 HOURS OF ADMISSION TO YOUR PATIENT ROOM.  SITTING IN THE RECLINER THROUGHOUT THE DAY IS IMPORTANT FOR DRINKING FLUIDS AND MOVING GAS THROUGHOUT THE GI TRACT.  COMPRESSION STOCKINGS SHOULD BE WORN [facility] UNLESS YOU ARE WALKING.   INCENTIVE SPIROMETER SHOULD BE USED EVERY HOUR WHILE AWAKE TO DECREASE POST-OPERATIVE COMPLICATIONS SUCH AS PNEUMONIA.  WHEN DISCHARGED HOME, IT IS IMPORTANT TO CONTINUE TO WALK EVERY HOUR AND USE THE INCENTIVE SPIROMETER EVERY HOUR.          CLEAR LIQUID DIET   Foods Allowed                                                                     Foods Excluded  Coffee and tea, regular and decaf                             liquids that you cannot  Plain Jell-O in any flavor                                             see through such as: Fruit ices (not with fruit pulp)                                     milk, soups, orange juice  Iced Popsicles                                     All solid food Carbonated beverages, regular and diet                                    Cranberry, grape and apple juices Sports  drinks like Gatorade Lightly seasoned clear broth or consume(fat free) Sugar, honey syrup  Sample Menu Breakfast                                Lunch                                     Supper Cranberry juice                    Beef broth                            Chicken broth Jell-O                                     Grape juice                           Apple juice Coffee or tea                        Jell-O                                      Popsicle                                                Coffee or tea                        Coffee or tea  _____________________________________________________________________    Take these medicines the morning of surgery with A SIP OF WATER: NONE                               You may not have any metal on your body including hair pins and              piercings  Do not wear jewelry, make-up, lotions, powders or perfumes, deodorant             Do not wear nail polish.  Do not shave  48 hours prior to surgery.               Do not bring valuables to the hospital. [facility] IS NOT             RESPONSIBLE   FOR VALUABLES.  Contacts, dentures or bridgework may not be worn into surgery.  Leave suitcase in the car. After surgery it may be brought to your room.     Patients discharged the day of surgery will not be allowed to drive home. IF YOU ARE HAVING SURGERY AND GOING HOME THE SAME DAY, YOU MUST HAVE AN ADULT TO DRIVE YOU HOME AND BE WITH YOU FOR 24 HOURS. YOU MAY GO HOME BY TAXI OR UBER OR ORTHERWISE, BUT AN ADULT MUST ACCOMPANY YOU HOME AND STAY WITH YOU FOR 24 HOURS.  Name and phone number  of your driver:  Special Instructions: N/A              Please read over the following fact sheets you were  given: _____________________________________________________________________             [facility] - Preparing for Surgery Before surgery, you can play an important role.  Because skin is not sterile, your skin needs to be as free of germs as possible.  You can reduce the number of germs on your skin by washing with CHG (chlorahexidine gluconate) soap before surgery.  CHG is an antiseptic cleaner which kills germs and bonds with the skin to continue killing germs even after washing. Please DO NOT use if you have an allergy to CHG or antibacterial soaps.  If your skin becomes reddened/irritated stop using the CHG and inform your nurse when you arrive at Short Stay. Do not shave (including legs and underarms) for at least 48 hours prior to the first CHG shower.  You may shave your face/neck. Please follow these instructions carefully:  1.  Shower with CHG Soap the night before surgery and the  morning of Surgery.  2.  If you choose to wash your hair, wash your hair first as usual with your  normal  shampoo.  3.  After you shampoo, rinse your hair and body thoroughly to remove the  shampoo.                           4.  Use CHG as you would any other liquid soap.  You can apply chg directly  to the skin and wash                       Gently with a scrungie or clean washcloth.  5.  Apply the CHG Soap to your body ONLY FROM THE NECK DOWN.   Do not use on face/ open                           Wound or open sores. Avoid contact with eyes, ears mouth and genitals (private parts).                       Wash face,  Genitals (private parts) with your normal soap.             6.  Wash thoroughly, paying special attention to the area where your surgery  will be performed.  7.  Thoroughly rinse your body with warm water from the neck down.  8.  DO NOT shower/wash with your normal soap after using and rinsing off  the CHG Soap.                9.  Pat yourself dry with a clean towel.            10.  Wear  clean pajamas.            11.  Place clean sheets on your bed the night of your first shower and do not  sleep with pets. Day of Surgery : Do not apply any lotions/deodorants the morning of surgery.  Please wear clean clothes to the hospital/surgery center.  FAILURE TO FOLLOW THESE INSTRUCTIONS MAY RESULT IN THE CANCELLATION OF YOUR SURGERY PATIENT SIGNATURE_________________________________  NURSE SIGNATURE__________________________________  ________________________________________________________________________   Morgan Olson  An incentive spirometer is a tool that can help  keep your lungs clear and active. This tool measures how well you are filling your lungs with each breath. Taking long deep breaths may help reverse or decrease the chance of developing breathing (pulmonary) problems (especially infection) following:  A long period of time when you are unable to move or be active. BEFORE THE PROCEDURE   If the spirometer includes an indicator to show your best effort, your nurse or respiratory therapist will set it to a desired goal.  If possible, sit up straight or lean slightly forward. Try not to slouch.  Hold the incentive spirometer in an upright position. INSTRUCTIONS FOR USE  1. Sit on the edge of your bed if possible, or sit up as far as you can in bed or on a chair. 2. Hold the incentive spirometer in an upright position. 3. Breathe out normally. 4. Place the mouthpiece in your mouth and seal your lips tightly around it. 5. Breathe in slowly and as deeply as possible, raising the piston or the ball toward the top of the column. 6. Hold your breath for 3-5 seconds or for as long as possible. Allow the piston or ball to fall to the bottom of the column. 7. Remove the mouthpiece from your mouth and breathe out normally. 8. Rest for a few seconds and repeat Steps 1 through 7 at least 10 times every 1-2 hours when you are awake. Take your time and take a few normal  breaths between deep breaths. 9. The spirometer may include an indicator to show your best effort. Use the indicator as a goal to work toward during each repetition. 10. After each set of 10 deep breaths, practice coughing to be sure your lungs are clear. If you have an incision (the cut made at the time of surgery), support your incision when coughing by placing a pillow or rolled up towels firmly against it. Once you are able to get out of bed, walk around indoors and cough well. You may stop using the incentive spirometer when instructed by your caregiver.  RISKS AND COMPLICATIONS  Take your time so you do not get dizzy or light-headed.  If you are in pain, you may need to take or ask for pain medication before doing incentive spirometry. It is harder to take a deep breath if you are having pain. AFTER USE  Rest and breathe slowly and easily.  It can be helpful to keep track of a log of your progress. Your caregiver can provide you with a simple table to help with this. If you are using the spirometer at home, follow these instructions: SEEK MEDICAL CARE IF:   You are having difficultly using the spirometer.  You have trouble using the spirometer as often as instructed.  Your pain medication is not giving enough relief while using the spirometer.  You develop fever of 100.5 F (38.1 C) or higher. SEEK IMMEDIATE MEDICAL CARE IF:   You cough up bloody sputum that had not been present before.  You develop fever of 102 F (38.9 C) or greater.  You develop worsening pain at or near the incision site. MAKE SURE YOU:   Understand these instructions.  Will watch your condition.  Will get help right away if you are not doing well or get worse. Document Released: 05/22/2006 Document Revised: 04/03/2011 Document Reviewed: 07/23/2006 ExitCare Patient Information 2014 [employer], [place].   ________________________________________________________________________  WHAT IS A BLOOD  TRANSFUSION? Blood Transfusion Information  A transfusion is the replacement of blood or some  of its parts. Blood is made up of multiple cells which provide different functions.  Red blood cells carry oxygen and are used for blood loss replacement.  White blood cells fight against infection.  Platelets control bleeding.  Plasma helps clot blood.  Other blood products are available for specialized needs, such as hemophilia or other clotting disorders. BEFORE THE TRANSFUSION  Who gives blood for transfusions?   Healthy volunteers who are fully evaluated to make sure their blood is safe. This is blood bank blood. Transfusion therapy is the safest it has ever been in the practice of medicine. Before blood is taken from a donor, a complete history is taken to make sure that person has no history of diseases nor engages in risky social behavior (examples are intravenous drug use or sexual activity with multiple partners). The donor's travel history is screened to minimize risk of transmitting infections, such as malaria. The donated blood is tested for signs of infectious diseases, such as HIV and hepatitis. The blood is then tested to be sure it is compatible with you in order to minimize the chance of a transfusion reaction. If you or a relative donates blood, this is often done in anticipation of surgery and is not appropriate for emergency situations. It takes many days to process the donated blood. RISKS AND COMPLICATIONS Although transfusion therapy is very safe and saves many lives, the main dangers of transfusion include:   Getting an infectious disease.  Developing a transfusion reaction. This is an allergic reaction to something in the blood you were given. Every precaution is taken to prevent this. The decision to have a blood transfusion has been considered carefully by your caregiver before blood is given. Blood is not given unless the benefits outweigh the risks. AFTER THE  TRANSFUSION  Right after receiving a blood transfusion, you will usually feel much better and more energetic. This is especially true if your red blood cells have gotten low (anemic). The transfusion raises the level of the red blood cells which carry oxygen, and this usually causes an energy increase.  The nurse administering the transfusion will monitor you carefully for complications. HOME CARE INSTRUCTIONS  No special instructions are needed after a transfusion. You may find your energy is better. Speak with your caregiver about any limitations on activity for underlying diseases you may have. SEEK MEDICAL CARE IF:   Your condition is not improving after your transfusion.  You develop redness or irritation at the intravenous (IV) site. SEEK IMMEDIATE MEDICAL CARE IF:  Any of the following symptoms occur over the next 12 hours:  Shaking chills.  You have a temperature by mouth above 102 F (38.9 C), not controlled by medicine.  Chest, back, or muscle pain.  People around you feel you are not acting correctly or are confused.  Shortness of breath or difficulty breathing.  Dizziness and fainting.  You get a rash or develop hives.  You have a decrease in urine output.  Your urine turns a dark color or changes to pink, red, or brown. Any of the following symptoms occur over the next 10 days:  You have a temperature by mouth above 102 F (38.9 C), not controlled by medicine.  Shortness of breath.  Weakness after normal activity.  The white part of the eye turns yellow (jaundice).  You have a decrease in the amount of urine or are urinating less often.  Your urine turns a dark color or changes to pink, red, or brown. Document Released:  01/07/2000 Document Revised: 04/03/2011 Document Reviewed: 08/26/2007 ExitCare Patient Information 2014 [place], [place].  _______________________________________________________________________

## 2018-07-04 ENCOUNTER — Other Ambulatory Visit (HOSPITAL_COMMUNITY)
Admission: RE | Admit: 2018-07-04 | Discharge: 2018-07-04 | Disposition: A | Discharge status: Home / Self Care | Payer: BC Managed Care – PPO | Source: Ambulatory Visit

## 2018-07-04 ENCOUNTER — Encounter (HOSPITAL_COMMUNITY): Payer: Self-pay

## 2018-07-04 ENCOUNTER — Encounter (HOSPITAL_COMMUNITY)
Admission: RE | Admit: 2018-07-04 | Discharge: 2018-07-04 | Disposition: A | Discharge status: Home / Self Care | Payer: BC Managed Care – PPO | Source: Ambulatory Visit | Attending: General Surgery | Admitting: General Surgery

## 2018-07-04 ENCOUNTER — Other Ambulatory Visit: Payer: Self-pay

## 2018-07-04 DIAGNOSIS — Z6841 Body Mass Index (BMI) 40.0 and over, adult: Secondary | ICD-10-CM | POA: Insufficient documentation

## 2018-07-04 DIAGNOSIS — Z1159 Encounter for screening for other viral diseases: Secondary | ICD-10-CM | POA: Diagnosis not present

## 2018-07-04 DIAGNOSIS — Z01812 Encounter for preprocedural laboratory examination: Secondary | ICD-10-CM | POA: Diagnosis not present

## 2018-07-04 HISTORY — DX: Depression, unspecified: F32.A

## 2018-07-04 LAB — CBC WITH DIFFERENTIAL/PLATELET
Abs Immature Granulocytes: 0.02 10*3/uL (ref 0.00–0.07)
Basophils Absolute: 0 10*3/uL (ref 0.0–0.1)
Basophils Relative: 1 %
Eosinophils Absolute: 0.2 10*3/uL (ref 0.0–0.5)
Eosinophils Relative: 2 %
HCT: 42.5 % (ref 36.0–46.0)
Hemoglobin: 13.7 g/dL (ref 12.0–15.0)
Immature Granulocytes: 0 %
Lymphocytes Relative: 16 %
Lymphs Abs: 1.3 10*3/uL (ref 0.7–4.0)
MCH: 27.5 pg (ref 26.0–34.0)
MCHC: 32.2 g/dL (ref 30.0–36.0)
MCV: 85.2 fL (ref 80.0–100.0)
Monocytes Absolute: 0.5 10*3/uL (ref 0.1–1.0)
Monocytes Relative: 6 %
Neutro Abs: 5.9 10*3/uL (ref 1.7–7.7)
Neutrophils Relative %: 75 %
Platelets: 272 10*3/uL (ref 150–400)
RBC: 4.99 MIL/uL (ref 3.87–5.11)
RDW: 13.4 % (ref 11.5–15.5)
WBC: 8 10*3/uL (ref 4.0–10.5)
nRBC: 0 % (ref 0.0–0.2)

## 2018-07-04 LAB — COMPREHENSIVE METABOLIC PANEL
ALT: 18 U/L (ref 0–44)
AST: 18 U/L (ref 15–41)
Albumin: 4.5 g/dL (ref 3.5–5.0)
Alkaline Phosphatase: 66 U/L (ref 38–126)
Anion gap: 9 (ref 5–15)
BUN: 21 mg/dL — ABNORMAL HIGH (ref 6–20)
CO2: 26 mmol/L (ref 22–32)
Calcium: 9.1 mg/dL (ref 8.9–10.3)
Chloride: 104 mmol/L (ref 98–111)
Creatinine, Ser: 0.77 mg/dL (ref 0.44–1.00)
GFR calc Af Amer: >60 mL/min (ref >60)
GFR calc non Af Amer: >60 mL/min (ref >60)
Glucose, Bld: 81 mg/dL (ref 70–99)
Potassium: 3.9 mmol/L (ref 3.5–5.1)
Sodium: 139 mmol/L (ref 135–145)
Total Bilirubin: 0.6 mg/dL (ref 0.3–1.2)
Total Protein: 7.4 g/dL (ref 6.5–8.1)

## 2018-07-05 LAB — NOVEL CORONAVIRUS, NAA (HOSP ORDER, SEND-OUT TO REF LAB; TAT 18-24 HRS): SARS-CoV-2, NAA: NOT DETECTED

## 2018-07-05 LAB — ABO/RH: ABO/RH(D): A POS

## 2018-07-05 NOTE — Progress Notes (Signed)
SPOKE W/  _patient     SCREENING SYMPTOMS OF COVID 19:   COUGH--no  RUNNY NOSE--- no  SORE THROAT---no  NASAL CONGESTION----no  SNEEZING----no  SHORTNESS OF BREATH---no  DIFFICULTY BREATHING---no  TEMP >100.0 -----no  UNEXPLAINED BODY ACHES------no  CHILLS --------no   HEADACHES ---------no  LOSS OF SMELL/ TASTE --------no    HAVE YOU OR ANY FAMILY MEMBER TRAVELLED PAST 14 DAYS OUT OF THE   COUNTY---no STATE----no COUNTRY----no  HAVE YOU OR ANY FAMILY MEMBER BEEN EXPOSED TO ANYONE WITH COVID 19? no    

## 2018-07-07 MED ORDER — BUPIVACAINE LIPOSOME 1.3 % IJ SUSP
20.0000 mL | INTRAMUSCULAR | Status: DC
  Filled 2018-07-07: qty 20

## 2018-07-08 ENCOUNTER — Encounter (HOSPITAL_COMMUNITY): Payer: Self-pay

## 2018-07-08 ENCOUNTER — Inpatient Hospital Stay (HOSPITAL_COMMUNITY)
Admission: RE | Admit: 2018-07-08 | Discharge: 2018-07-09 | DRG: 621 | Disposition: A | Discharge status: Home / Self Care | Payer: BC Managed Care – PPO | Attending: General Surgery | Admitting: General Surgery

## 2018-07-08 ENCOUNTER — Inpatient Hospital Stay (HOSPITAL_COMMUNITY): Payer: BC Managed Care – PPO | Admitting: Certified Registered"

## 2018-07-08 ENCOUNTER — Encounter (HOSPITAL_COMMUNITY)
Admission: RE | Disposition: A | Discharge status: Home / Self Care | Payer: Self-pay | Source: Home / Self Care | Attending: General Surgery

## 2018-07-08 ENCOUNTER — Other Ambulatory Visit: Payer: Self-pay

## 2018-07-08 DIAGNOSIS — Z833 Family history of diabetes mellitus: Secondary | ICD-10-CM

## 2018-07-08 DIAGNOSIS — Z809 Family history of malignant neoplasm, unspecified: Secondary | ICD-10-CM

## 2018-07-08 DIAGNOSIS — Z6841 Body Mass Index (BMI) 40.0 and over, adult: Secondary | ICD-10-CM

## 2018-07-08 DIAGNOSIS — K5900 Constipation, unspecified: Secondary | ICD-10-CM | POA: Diagnosis present

## 2018-07-08 DIAGNOSIS — K449 Diaphragmatic hernia without obstruction or gangrene: Secondary | ICD-10-CM | POA: Diagnosis present

## 2018-07-08 DIAGNOSIS — M545 Low back pain: Secondary | ICD-10-CM | POA: Diagnosis present

## 2018-07-08 DIAGNOSIS — Z8249 Family history of ischemic heart disease and other diseases of the circulatory system: Secondary | ICD-10-CM | POA: Diagnosis not present

## 2018-07-08 DIAGNOSIS — Z9884 Bariatric surgery status: Secondary | ICD-10-CM

## 2018-07-08 HISTORY — PX: LAPAROSCOPIC GASTRIC SLEEVE RESECTION: SHX5895

## 2018-07-08 LAB — HEMOGLOBIN AND HEMATOCRIT, BLOOD
HCT: 43.5 % (ref 36.0–46.0)
Hemoglobin: 14.5 g/dL (ref 12.0–15.0)

## 2018-07-08 LAB — PREGNANCY, URINE: Preg Test, Ur: NEGATIVE

## 2018-07-08 LAB — TYPE AND SCREEN
ABO/RH(D): A POS
Antibody Screen: NEGATIVE

## 2018-07-08 SURGERY — GASTRECTOMY, SLEEVE, LAPAROSCOPIC
Anesthesia: General | Site: Abdomen

## 2018-07-08 MED ORDER — 0.9 % SODIUM CHLORIDE (POUR BTL) OPTIME
TOPICAL | Status: DC | PRN
  Administered 2018-07-08: 1000 mL

## 2018-07-08 MED ORDER — MEPERIDINE HCL 50 MG/ML IJ SOLN: 6.2500 mg | INTRAMUSCULAR | Status: DC | PRN

## 2018-07-08 MED ORDER — GABAPENTIN 300 MG PO CAPS
300.0000 mg | ORAL_CAPSULE | ORAL | Status: AC
  Administered 2018-07-08: 300 mg via ORAL
  Filled 2018-07-08: qty 1

## 2018-07-08 MED ORDER — ONDANSETRON HCL 4 MG/2ML IJ SOLN
4.0000 mg | Freq: Four times a day (QID) | INTRAMUSCULAR | Status: DC | PRN
  Administered 2018-07-08: 4 mg via INTRAVENOUS
  Filled 2018-07-08: qty 2

## 2018-07-08 MED ORDER — PROMETHAZINE HCL 25 MG/ML IJ SOLN: 12.5000 mg | Freq: Four times a day (QID) | INTRAMUSCULAR | Status: DC | PRN

## 2018-07-08 MED ORDER — CHLORHEXIDINE GLUCONATE 4 % EX LIQD: 60.0000 mL | Freq: Once | CUTANEOUS | Status: DC

## 2018-07-08 MED ORDER — MORPHINE SULFATE (PF) 2 MG/ML IV SOLN
1.0000 mg | INTRAVENOUS | Status: DC | PRN
  Administered 2018-07-08: 2 mg via INTRAVENOUS
  Filled 2018-07-08: qty 1

## 2018-07-08 MED ORDER — ACETAMINOPHEN 500 MG PO TABS
1000.0000 mg | ORAL_TABLET | Freq: Three times a day (TID) | ORAL | Status: DC
  Administered 2018-07-08 – 2018-07-09 (×2): 1000 mg via ORAL
  Filled 2018-07-08 (×3): qty 2

## 2018-07-08 MED ORDER — PANTOPRAZOLE SODIUM 40 MG IV SOLR
40.0000 mg | Freq: Every day | INTRAVENOUS | Status: DC
  Administered 2018-07-08: 40 mg via INTRAVENOUS
  Filled 2018-07-08: qty 40

## 2018-07-08 MED ORDER — MIDAZOLAM HCL 2 MG/2ML IJ SOLN
INTRAMUSCULAR | Status: AC
  Filled 2018-07-08: qty 2

## 2018-07-08 MED ORDER — FENTANYL CITRATE (PF) 250 MCG/5ML IJ SOLN
INTRAMUSCULAR | Status: DC | PRN
  Administered 2018-07-08 (×2): 100 ug via INTRAVENOUS

## 2018-07-08 MED ORDER — SCOPOLAMINE 1 MG/3DAYS TD PT72
1.0000 | MEDICATED_PATCH | TRANSDERMAL | Status: DC
  Administered 2018-07-08: 1.5 mg via TRANSDERMAL
  Filled 2018-07-08: qty 1

## 2018-07-08 MED ORDER — ENOXAPARIN SODIUM 30 MG/0.3ML ~~LOC~~ SOLN
30.0000 mg | Freq: Two times a day (BID) | SUBCUTANEOUS | Status: DC
  Administered 2018-07-08 – 2018-07-09 (×2): 30 mg via SUBCUTANEOUS
  Filled 2018-07-08 (×2): qty 0.3

## 2018-07-08 MED ORDER — PROPOFOL 10 MG/ML IV BOLUS
INTRAVENOUS | Status: AC
  Filled 2018-07-08: qty 20

## 2018-07-08 MED ORDER — SIMETHICONE 80 MG PO CHEW
80.0000 mg | CHEWABLE_TABLET | Freq: Four times a day (QID) | ORAL | Status: DC | PRN
  Administered 2018-07-09: 04:00:00 80 mg via ORAL
  Filled 2018-07-08: qty 1

## 2018-07-08 MED ORDER — ONDANSETRON HCL 4 MG/2ML IJ SOLN
INTRAMUSCULAR | Status: DC | PRN
  Administered 2018-07-08: 4 mg via INTRAVENOUS

## 2018-07-08 MED ORDER — LIDOCAINE 2% (20 MG/ML) 5 ML SYRINGE
INTRAMUSCULAR | Status: DC | PRN
  Administered 2018-07-08: 1.5 mg/kg/h via INTRAVENOUS

## 2018-07-08 MED ORDER — HEPARIN SODIUM (PORCINE) 5000 UNIT/ML IJ SOLN
5000.0000 [IU] | INTRAMUSCULAR | Status: AC
  Administered 2018-07-08: 5000 [IU] via SUBCUTANEOUS
  Filled 2018-07-08: qty 1

## 2018-07-08 MED ORDER — FENTANYL CITRATE (PF) 250 MCG/5ML IJ SOLN
INTRAMUSCULAR | Status: AC
  Filled 2018-07-08: qty 5

## 2018-07-08 MED ORDER — ROCURONIUM BROMIDE 10 MG/ML (PF) SYRINGE
PREFILLED_SYRINGE | INTRAVENOUS | Status: AC
  Filled 2018-07-08: qty 10

## 2018-07-08 MED ORDER — HYDROMORPHONE HCL 1 MG/ML IJ SOLN
INTRAMUSCULAR | Status: AC
  Administered 2018-07-08: 0.25 mg via INTRAVENOUS
  Filled 2018-07-08: qty 1

## 2018-07-08 MED ORDER — SODIUM CHLORIDE 0.9 % IV SOLN
2.0000 g | INTRAVENOUS | Status: AC
  Administered 2018-07-08: 2 g via INTRAVENOUS
  Filled 2018-07-08: qty 2

## 2018-07-08 MED ORDER — LACTATED RINGERS IV SOLN
INTRAVENOUS | Status: DC
  Administered 2018-07-08: 12:00:00 via INTRAVENOUS

## 2018-07-08 MED ORDER — APREPITANT 40 MG PO CAPS
40.0000 mg | ORAL_CAPSULE | ORAL | Status: AC
  Administered 2018-07-08: 40 mg via ORAL
  Filled 2018-07-08: qty 1

## 2018-07-08 MED ORDER — DEXAMETHASONE SODIUM PHOSPHATE 10 MG/ML IJ SOLN
INTRAMUSCULAR | Status: AC
  Filled 2018-07-08: qty 1

## 2018-07-08 MED ORDER — SODIUM CHLORIDE (PF) 0.9 % IJ SOLN
INTRAMUSCULAR | Status: DC | PRN
  Administered 2018-07-08: 50 mL

## 2018-07-08 MED ORDER — PROPOFOL 10 MG/ML IV BOLUS
INTRAVENOUS | Status: DC | PRN
  Administered 2018-07-08: 150 mg via INTRAVENOUS

## 2018-07-08 MED ORDER — KETAMINE HCL 10 MG/ML IJ SOLN
INTRAMUSCULAR | Status: DC | PRN
  Administered 2018-07-08: 30 mg via INTRAVENOUS

## 2018-07-08 MED ORDER — ROCURONIUM BROMIDE 50 MG/5ML IV SOSY
PREFILLED_SYRINGE | INTRAVENOUS | Status: DC | PRN
  Administered 2018-07-08: 10 mg via INTRAVENOUS
  Administered 2018-07-08: 50 mg via INTRAVENOUS
  Administered 2018-07-08: 10 mg via INTRAVENOUS

## 2018-07-08 MED ORDER — BUPIVACAINE LIPOSOME 1.3 % IJ SUSP
INTRAMUSCULAR | Status: DC | PRN
  Administered 2018-07-08: 20 mL

## 2018-07-08 MED ORDER — DEXAMETHASONE SODIUM PHOSPHATE 10 MG/ML IJ SOLN
INTRAMUSCULAR | Status: DC | PRN
  Administered 2018-07-08: 5 mg via INTRAVENOUS

## 2018-07-08 MED ORDER — GABAPENTIN 100 MG PO CAPS
200.0000 mg | ORAL_CAPSULE | Freq: Two times a day (BID) | ORAL | Status: DC
  Administered 2018-07-09: 200 mg via ORAL
  Filled 2018-07-08: qty 2

## 2018-07-08 MED ORDER — ONDANSETRON HCL 4 MG/2ML IJ SOLN
INTRAMUSCULAR | Status: AC
  Filled 2018-07-08: qty 2

## 2018-07-08 MED ORDER — ACETAMINOPHEN 160 MG/5ML PO SOLN
1000.0000 mg | Freq: Three times a day (TID) | ORAL | Status: DC
  Administered 2018-07-09: 1000 mg via ORAL
  Filled 2018-07-08: qty 40.6

## 2018-07-08 MED ORDER — OXYCODONE HCL 5 MG/5ML PO SOLN: 5.0000 mg | ORAL | Status: DC | PRN

## 2018-07-08 MED ORDER — HYDROMORPHONE HCL 1 MG/ML IJ SOLN
0.2500 mg | INTRAMUSCULAR | Status: DC | PRN
  Administered 2018-07-08 (×4): 0.25 mg via INTRAVENOUS

## 2018-07-08 MED ORDER — MIDAZOLAM HCL 5 MG/5ML IJ SOLN
INTRAMUSCULAR | Status: DC | PRN
  Administered 2018-07-08: 2 mg via INTRAVENOUS

## 2018-07-08 MED ORDER — STERILE WATER FOR IRRIGATION IR SOLN
Status: DC | PRN
  Administered 2018-07-08: 2000 mL

## 2018-07-08 MED ORDER — ACETAMINOPHEN 500 MG PO TABS
1000.0000 mg | ORAL_TABLET | ORAL | Status: AC
  Administered 2018-07-08: 1000 mg via ORAL
  Filled 2018-07-08: qty 2

## 2018-07-08 MED ORDER — LACTATED RINGERS IR SOLN
Status: DC | PRN
  Administered 2018-07-08: 1000 mL

## 2018-07-08 MED ORDER — SUGAMMADEX SODIUM 200 MG/2ML IV SOLN
INTRAVENOUS | Status: DC | PRN
  Administered 2018-07-08: 300 mg via INTRAVENOUS

## 2018-07-08 MED ORDER — ENSURE MAX PROTEIN PO LIQD
2.0000 [oz_av] | ORAL | Status: DC
  Administered 2018-07-09 (×4): 2 [oz_av] via ORAL

## 2018-07-08 MED ORDER — SODIUM CHLORIDE (PF) 0.9 % IJ SOLN
INTRAMUSCULAR | Status: AC
  Filled 2018-07-08: qty 50

## 2018-07-08 MED ORDER — KETOROLAC TROMETHAMINE 15 MG/ML IJ SOLN
15.0000 mg | Freq: Three times a day (TID) | INTRAMUSCULAR | Status: DC | PRN
  Administered 2018-07-08: 15 mg via INTRAVENOUS
  Filled 2018-07-08: qty 1

## 2018-07-08 MED ORDER — KCL IN DEXTROSE-NACL 20-5-0.45 MEQ/L-%-% IV SOLN
INTRAVENOUS | Status: DC
  Administered 2018-07-08 – 2018-07-09 (×3): via INTRAVENOUS
  Filled 2018-07-08 (×3): qty 1000

## 2018-07-08 MED ORDER — LIDOCAINE 2% (20 MG/ML) 5 ML SYRINGE
INTRAMUSCULAR | Status: DC | PRN
  Administered 2018-07-08: 100 mg via INTRAVENOUS

## 2018-07-08 MED ORDER — LIDOCAINE 2% (20 MG/ML) 5 ML SYRINGE
INTRAMUSCULAR | Status: AC
  Filled 2018-07-08: qty 5

## 2018-07-08 MED ORDER — SUGAMMADEX SODIUM 500 MG/5ML IV SOLN
INTRAVENOUS | Status: AC
  Filled 2018-07-08: qty 5

## 2018-07-08 MED ORDER — DIPHENHYDRAMINE HCL 50 MG/ML IJ SOLN: 12.5000 mg | Freq: Three times a day (TID) | INTRAMUSCULAR | Status: DC | PRN

## 2018-07-08 MED ORDER — ONDANSETRON HCL 4 MG/2ML IJ SOLN
4.0000 mg | Freq: Once | INTRAMUSCULAR | Status: AC | PRN
  Administered 2018-07-08: 4 mg via INTRAVENOUS

## 2018-07-08 MED ORDER — DEXAMETHASONE SODIUM PHOSPHATE 4 MG/ML IJ SOLN: 4.0000 mg | INTRAMUSCULAR | Status: DC

## 2018-07-08 SURGICAL SUPPLY — 72 items
APPLICATOR COTTON TIP 6 STRL (MISCELLANEOUS) IMPLANT
APPLICATOR COTTON TIP 6IN STRL (MISCELLANEOUS)
APPLIER CLIP ROT 10 11.4 M/L (STAPLE)
APPLIER CLIP ROT 13.4 12 LRG (CLIP)
BANDAGE ADH SHEER 1  50/CT (GAUZE/BANDAGES/DRESSINGS) ×3 IMPLANT
BENZOIN TINCTURE PRP APPL 2/3 (GAUZE/BANDAGES/DRESSINGS) ×3 IMPLANT
BLADE SURG SZ11 CARB STEEL (BLADE) ×3 IMPLANT
CABLE HIGH FREQUENCY MONO STRZ (ELECTRODE) ×3 IMPLANT
CHLORAPREP W/TINT 26 (MISCELLANEOUS) ×6 IMPLANT
CLIP APPLIE ROT 10 11.4 M/L (STAPLE) IMPLANT
CLIP APPLIE ROT 13.4 12 LRG (CLIP) IMPLANT
CLOSURE WOUND 1/2 X4 (GAUZE/BANDAGES/DRESSINGS) ×1
COVER SURGICAL LIGHT HANDLE (MISCELLANEOUS) ×3 IMPLANT
COVER WAND RF STERILE (DRAPES) ×3 IMPLANT
DECANTER SPIKE VIAL GLASS SM (MISCELLANEOUS) ×3 IMPLANT
DEVICE SUT QUICK LOAD TK 5 (STAPLE) ×2 IMPLANT
DEVICE SUT TI-KNOT TK 5X26 (MISCELLANEOUS) IMPLANT
DEVICE SUTURE ENDOST 10MM (ENDOMECHANICALS) ×3 IMPLANT
DEVICE TI KNOT TK5 (MISCELLANEOUS)
DISSECTOR BLUNT TIP ENDO 5MM (MISCELLANEOUS) IMPLANT
DRAPE UTILITY XL STRL (DRAPES) ×6 IMPLANT
ELECT L-HOOK LAP 45CM DISP (ELECTROSURGICAL)
ELECT PENCIL ROCKER SW 15FT (MISCELLANEOUS) IMPLANT
ELECT REM PT RETURN 15FT ADLT (MISCELLANEOUS) ×3 IMPLANT
ELECTRODE L-HOOK LAP 45CM DISP (ELECTROSURGICAL) IMPLANT
GAUZE SPONGE 2X2 8PLY STRL LF (GAUZE/BANDAGES/DRESSINGS) IMPLANT
GAUZE SPONGE 4X4 12PLY STRL (GAUZE/BANDAGES/DRESSINGS) IMPLANT
GLOVE BIO SURGEON STRL SZ7.5 (GLOVE) ×3 IMPLANT
GLOVE INDICATOR 8.0 STRL GRN (GLOVE) ×3 IMPLANT
GOWN STRL REUS W/TWL XL LVL3 (GOWN DISPOSABLE) ×9 IMPLANT
GRASPER SUT TROCAR 14GX15 (MISCELLANEOUS) ×3 IMPLANT
HOVERMATT SINGLE USE (MISCELLANEOUS) ×3 IMPLANT
KIT BASIN OR (CUSTOM PROCEDURE TRAY) ×3 IMPLANT
KIT TURNOVER KIT A (KITS) IMPLANT
MARKER SKIN DUAL TIP RULER LAB (MISCELLANEOUS) ×3 IMPLANT
NEEDLE SPNL 22GX3.5 QUINCKE BK (NEEDLE) ×3 IMPLANT
PACK UNIVERSAL I (CUSTOM PROCEDURE TRAY) ×3 IMPLANT
QUICK LOAD TK 5 (STAPLE) ×1
RELOAD STAPLER 60MM BLK (STAPLE) IMPLANT
RELOAD STAPLER BLUE 60MM (STAPLE) ×1 IMPLANT
RELOAD STAPLER GOLD 60MM (STAPLE) IMPLANT
RELOAD STAPLER GREEN 60MM (STAPLE) ×2 IMPLANT
SCISSORS LAP 5X45 EPIX DISP (ENDOMECHANICALS) IMPLANT
SEALANT SURGICAL APPL DUAL CAN (MISCELLANEOUS) IMPLANT
SET IRRIG TUBING LAPAROSCOPIC (IRRIGATION / IRRIGATOR) ×3 IMPLANT
SET TUBE SMOKE EVAC HIGH FLOW (TUBING) ×3 IMPLANT
SHEARS HARMONIC ACE PLUS 45CM (MISCELLANEOUS) ×3 IMPLANT
SLEEVE GASTRECTOMY 40FR VISIGI (MISCELLANEOUS) ×3 IMPLANT
SLEEVE XCEL OPT CAN 5 100 (ENDOMECHANICALS) ×9 IMPLANT
SOLUTION ANTI FOG 6CC (MISCELLANEOUS) ×3 IMPLANT
SPONGE GAUZE 2X2 STER 10/PKG (GAUZE/BANDAGES/DRESSINGS)
SPONGE LAP 18X18 RF (DISPOSABLE) ×3 IMPLANT
STAPLER ECHELON BIOABSB 60 FLE (MISCELLANEOUS) ×12 IMPLANT
STAPLER ECHELON LONG 60 440 (INSTRUMENTS) ×3 IMPLANT
STAPLER RELOAD 60MM BLK (STAPLE)
STAPLER RELOAD BLUE 60MM (STAPLE) ×3
STAPLER RELOAD GOLD 60MM (STAPLE)
STAPLER RELOAD GREEN 60MM (STAPLE) ×6
STRIP CLOSURE SKIN 1/2X4 (GAUZE/BANDAGES/DRESSINGS) ×2 IMPLANT
SUT MNCRL AB 4-0 PS2 18 (SUTURE) ×3 IMPLANT
SUT SURGIDAC NAB ES-9 0 48 120 (SUTURE) ×3 IMPLANT
SUT VICRYL 0 TIES 12 18 (SUTURE) ×3 IMPLANT
SYR 20CC LL (SYRINGE) ×3 IMPLANT
SYR 50ML LL SCALE MARK (SYRINGE) ×3 IMPLANT
TOWEL OR 17X26 10 PK STRL BLUE (TOWEL DISPOSABLE) ×3 IMPLANT
TOWEL OR NON WOVEN STRL DISP B (DISPOSABLE) ×3 IMPLANT
TROCAR BLADELESS 15MM (ENDOMECHANICALS) ×3 IMPLANT
TROCAR BLADELESS OPT 5 100 (ENDOMECHANICALS) ×3 IMPLANT
TUBE CALIBRATION LAPBAND (TUBING) ×3 IMPLANT
TUBING CONNECTING 10 (TUBING) ×4 IMPLANT
TUBING CONNECTING 10' (TUBING) ×2
TUBING ENDO SMARTCAP (MISCELLANEOUS) ×3 IMPLANT

## 2018-07-08 NOTE — Discharge Instructions (Signed)
° ° ° °GASTRIC BYPASS/SLEEVE ° Home Care Instructions ° ° These instructions are to help you care for yourself when you go home. ° °Call: If you have any problems. °• Call [phone] and ask for the surgeon on call °• If you need immediate help, come to the ER at [facility].  °• Tell the ER staff that you are a new post-op gastric bypass or gastric sleeve patient °  °Signs and symptoms to report: • Severe vomiting or nausea °o If you cannot keep down clear liquids for longer than 1 day, call your surgeon  °• Abdominal pain that does not get better after taking your pain medication °• Fever over 100.4° F with chills °• Heart beating over 100 beats a minute °• Shortness of breath at rest °• Chest pain °•  Redness, swelling, drainage, or foul odor at incision (surgical) sites °•  If your incisions open or pull apart °• Swelling or pain in calf (lower leg) °• Diarrhea (Loose bowel movements that happen often), frequent watery, uncontrolled bowel movements °• Constipation, (no bowel movements for 3 days) if this happens: Pick one °o Milk of Magnesia, 2 tablespoons by mouth, 3 times a day for 2 days if needed °o Stop taking Milk of Magnesia once you have a bowel movement °o Call your doctor if constipation continues °Or °o Miralax  (instead of Milk of Magnesia) following the label instructions °o Stop taking Miralax once you have a bowel movement °o Call your doctor if constipation continues °• Anything you think is not normal °  °Normal side effects after surgery: • Unable to sleep at night or unable to focus °• Irritability or moody °• Being tearful (crying) or depressed °These are common complaints, possibly related to your anesthesia medications that put you to sleep, stress of surgery, and change in lifestyle.  This usually goes away a few weeks after surgery.  If these feelings continue, call your primary care doctor. °  °Wound Care: You may have surgical glue, steri-strips, or staples over your incisions after  surgery °• Surgical glue:  Looks like a clear film over your incisions and will wear off a little at a time °• Steri-strips: Strips of tape over your incisions. You may notice a yellowish color on the skin under the steri-strips. This is used to make the   steri-strips stick better. Do not pull the steri-strips off - let them fall off °• Staples: Staples may be removed before you leave the hospital °o If you go home with staples, call [facility], [phone] at for an appointment with your surgeon’s nurse to have staples removed 10 days after surgery. °• Showering: You may shower two (2) days after your surgery unless your surgeon tells you differently °o Wash gently around incisions with warm soapy water, rinse well, and gently pat dry  °o No tub baths until staples are removed, steri-strips fall off or glue is gone.  °  °Medications: • Medications should be liquid or crushed if larger than the size of a dime °• Extended release pills (medication that release a little bit at a time through the day) should NOT be crushed or cut. (examples include XL, ER, DR, SR) °• Depending on the size and number of medications you take, you may need to space (take a few throughout the day)/change the time you take your medications so that you do not over-fill your pouch (smaller stomach) °• Make sure you follow-up with your primary care doctor to   make medication changes needed during rapid weight loss and life-style changes °• If you have diabetes, follow up with the doctor that orders your diabetes medication(s) within one week after surgery and check your blood sugar regularly. °• Do not drive while taking prescription pain medication  °• It is ok to take Tylenol by the bottle instructions with your pain medicine or instead of your pain medicine as needed.  DO NOT TAKE NSAIDS (EXAMPLES OF NSAIDS:  IBUPROFREN/ NAPROXEN)  °Diet:                    First 2 Weeks ° You will see the dietician t about two (2) weeks  after your surgery. The dietician will increase the types of foods you can eat if you are handling liquids well: °• If you have severe vomiting or nausea and cannot keep down clear liquids lasting longer than 1 day, call your surgeon @ ([phone]) °Protein Shake °• Drink at least 2 ounces of shake 5-6 times per day °• Each serving of protein shakes (usually 8 - 12 ounces) should have: °o 15 grams of protein  °o And no more than 5 grams of carbohydrate  °• Goal for protein each day: °o Men = 80 grams per day °o Women = 60 grams per day °• Protein powder may be added to fluids such as non-fat milk or Lactaid milk or unsweetened Soy/Almond milk (limit to 35 grams added protein powder per serving) ° °Hydration °• Slowly increase the amount of water and other clear liquids as tolerated (See Acceptable Fluids) °• Slowly increase the amount of protein shake as tolerated  °•  Sip fluids slowly and throughout the day.  Do not use straws. °• May use sugar substitutes in small amounts (no more than 6 - 8 packets per day; i.e. Splenda) ° °Fluid Goal °• The first goal is to drink at least 8 ounces of protein shake/drink per day (or as directed by the nutritionist); some examples of protein shakes are Syntrax Nectar, Adkins Advantage, EAS Edge HP, and Unjury. See handout from pre-op Bariatric Education Class: °o Slowly increase the amount of protein shake you drink as tolerated °o You may find it easier to slowly sip shakes throughout the day °o It is important to get your proteins in first °• Your fluid goal is to drink 64 - 100 ounces of fluid daily °o It may take a few weeks to build up to this °• 32 oz (or more) should be clear liquids  °And  °• 32 oz (or more) should be full liquids (see below for examples) °• Liquids should not contain sugar, caffeine, or carbonation ° °Clear Liquids: °• Water or Sugar-free flavored water (i.e. Fruit H2O, Propel) °• Decaffeinated coffee or tea (sugar-free) °• Crystal Lite, Wyler’s Lite,  Minute Maid Lite °• Sugar-free Jell-O °• Bouillon or broth °• Sugar-free Popsicle:   *Less than 20 calories each; Limit 1 per day ° °Full Liquids: °Protein Shakes/Drinks + 2 choices per day of other full liquids °• Full liquids must be: °o No More Than 15 grams of Carbs per serving  °o No More Than 3 grams of Fat per serving °• Strained low-fat cream soup (except Cream of Potato or Tomato) °• Non-Fat milk °• Fat-free Lactaid Milk °• Unsweetened Soy Or Unsweetened Almond Milk °• Low Sugar yogurt (Dannon Lite & Fit, Greek yogurt; Oikos Triple Zero; Chobani Simply 100; Yoplait 100 calorie Greek - No Fruit on the Bottom) ° °  °Vitamins   and Minerals • Start 1 day after surgery unless otherwise directed by your surgeon °• 2 Chewable Bariatric Specific Multivitamin / Multimineral Supplement with iron (Example: Bariatric Advantage Multi EA) °• Chewable Calcium with Vitamin D-3 °(Example: 3 Chewable Calcium Plus 600 with Vitamin D-3) °o Take 500 mg three (3) times a day for a total of 1500 mg each day °o Do not take all 3 doses of calcium at one time as it may cause constipation, and you can only absorb 500 mg  at a time  °o Do not mix multivitamins containing iron with calcium supplements; take 2 hours apart °• Menstruating women and those with a history of anemia (a blood disease that causes weakness) may need extra iron °o Talk with your doctor to see if you need more iron °• Do not stop taking or change any vitamins or minerals until you talk to your dietitian or surgeon °• Your Dietitian and/or surgeon must approve all vitamin and mineral supplements °  °Activity and Exercise: Limit your physical activity as instructed by your doctor.  It is important to continue walking at home.  During this time, use these guidelines: °• Do not lift anything greater than ten (10) pounds for at least two (2) weeks °• Do not go back to work or drive until your surgeon says you can °• You may have sex when you feel comfortable  °o It is  VERY important for female patients to use a reliable birth control method; fertility often increases after surgery  °o All hormonal birth control will be ineffective for 30 days after surgery due to medications given during surgery a barrier method must be used. °o Do not get pregnant for at least 18 months °• Start exercising as soon as your doctor tells you that you can °o Make sure your doctor approves any physical activity °• Start with a simple walking program °• Walk 5-15 minutes each day, 7 days per week.  °• Slowly increase until you are walking 30-45 minutes per day °Consider joining our BELT program. [phone] or email [email or web address] °  °Special Instructions Things to remember: °• Use your CPAP when sleeping if this applies to you ° °• [facility] has two free Bariatric Surgery Support Groups that meet monthly °o The 3rd Thursday of each month, 6 pm, [facility]  °o The 2nd Friday of each month, 11:45 am in the private dining room in the basement of [place] [facility] °• It is very important to keep all follow up appointments with your surgeon, dietitian, primary care physician, and behavioral health practitioner °• Routine follow up schedule with your surgeon include appointments at 2-3 weeks, 6-8 weeks, 6 months, and 1 year at a minimum.  Your surgeon may request to see you more often.   °o After the first year, please follow up with your bariatric surgeon and dietitian at least once a year in order to maintain best weight loss results °[facility]: [phone] °[facility]: [phone] °Bariatric Nurse Coordinator: [phone] °  °   Reviewed and Endorsed  °by [facility] Patient Education Committee, June, 2016 °Edits Approved: Aug, 2018 ° ° ° °

## 2018-07-08 NOTE — Progress Notes (Signed)
PHARMACY CONSULT FOR:  Risk Assessment for Post-Discharge VTE Following Bariatric Surgery  Post-Discharge VTE Risk Assessment: This patient's probability of 30-day post-discharge VTE is increased due to the factors marked:   Female    Age >/=60 years  x  BMI >/=50 kg/m2    CHF    Dyspnea at Rest    Paraplegia  x  Non-gastric-band surgery    Operation Time >/=3 hr    Return to OR     Length of Stay >/= 3 d   Predicted probability of 30-day post-discharge VTE: 0.27%  Recommendation for Discharge: No pharmacologic prophylaxis post-discharge  Morgan Olson is a 28 y.o. female who underwent  Laparoscopic sleeve gastrectomy with hiatal hernia repair on 07/08/18.   Case start: 1257 Case end: 1357  No Known Allergies  Patient Measurements: Height: 5\' 2"  (157.5 cm) Weight: 287 lb (130.2 kg) IBW/kg (Calculated) : 50.1 Body mass index is 52.49 kg/m.  No results for input(s): WBC, HGB, HCT, PLT, APTT, CREATININE, LABCREA, CREATININE, CREAT24HRUR, MG, PHOS, ALBUMIN, PROT, ALBUMIN, AST, ALT, ALKPHOS, BILITOT, BILIDIR, IBILI in the last 72 hours. Estimated Creatinine Clearance: 136.9 mL/min (by C-G formula based on SCr of 0.77 mg/dL).    Past Medical History:  Diagnosis Date  . Asthma    brochitic  . Depression    hx of no meds      Medications Prior to Admission  Medication Sig Dispense Refill Last Dose  . acetaminophen (TYLENOL) 500 MG tablet Take 1,000 mg by mouth every 6 (six) hours as needed for moderate pain.   07/05/2018  . levonorgestrel (MIRENA, 52 MG,) 20 MCG/24HR IUD 1 Device by Intrauterine route once.   [id number]  . Lidocaine-Glycerin (PREPARATION H EX) Apply 1 application topically daily as needed (hemorrhoids).   Past Week at Unknown time  . sennosides-docusate sodium (SENOKOT-S) 8.6-50 MG tablet Take 1 tablet by mouth 2 (two) times daily as needed for constipation.   07/07/2018 at [place], PharmD 07/08/2018,3:02 PM

## 2018-07-08 NOTE — H&P (Signed)
Morgan Olson is an 28 y.o. female.   Chief Complaint: here for surgery HPI: 29 year old female presents today for laparoscopic sleeve gastrectomy for her severe obesity.  I initially met her in late October 2019.  She has completed her bariatric surgery evaluation.  She received psychological and nutritional counseling.  She denies any medical changes since I initially saw her in the office.  The only issue has been some constipation since starting her preoperative diet.  She denies any trips to the emergency room or hospital.  She denies any chest pain, chest pressure, shortness of breath, orthopnea, paroxysmal nocturnal dyspnea, abdominal pain, hematemesis, melena hematochezia.  She denies any dyspnea on exertion.  Past Medical History:  Diagnosis Date  . Asthma    brochitic  . Depression    hx of no meds     Past Surgical History:  Procedure Laterality Date  . GANGLION CYST EXCISION      Family History  Problem Relation Age of Onset  . Diabetes Maternal Aunt   . Cancer Maternal Aunt   . Heart disease Maternal Grandmother   . Diabetes Maternal Grandmother   . Cancer Maternal Grandmother   . Hypertension Paternal Grandmother    Social History:  reports that she has never smoked. She has never used smokeless tobacco. She reports that she does not drink alcohol or use drugs.  Allergies: No Known Allergies  Medications Prior to Admission  Medication Sig Dispense Refill  . acetaminophen (TYLENOL) 500 MG tablet Take 1,000 mg by mouth every 6 (six) hours as needed for moderate pain.    Marland Kitchen levonorgestrel (MIRENA, 52 MG,) 20 MCG/24HR IUD 1 Device by Intrauterine route once.    . Lidocaine-Glycerin (PREPARATION H EX) Apply 1 application topically daily as needed (hemorrhoids).    . sennosides-docusate sodium (SENOKOT-S) 8.6-50 MG tablet Take 1 tablet by mouth 2 (two) times daily as needed for constipation.      Results for orders placed or performed during the hospital encounter of  07/08/18 (from the past 48 hour(s))  Pregnancy, urine     Status: None   Collection Time: 07/08/18 10:56 AM  Result Value Ref Range   Preg Test, Ur NEGATIVE NEGATIVE    Comment:        THE SENSITIVITY OF THIS METHODOLOGY IS >20 mIU/mL. Performed at [facility], [place] [address], [place], [place] [address]    No results found.  Review of Systems  Constitutional: Negative for weight loss.  HENT: Negative for nosebleeds.   Eyes: Negative for blurred vision.  Respiratory: Negative for shortness of breath.   Cardiovascular: Negative for chest pain, palpitations, orthopnea and PND.       Denies DOE  Gastrointestinal: Positive for constipation. Negative for abdominal pain, diarrhea and vomiting.  Genitourinary: Negative for dysuria and hematuria.  Musculoskeletal:       Central low back pain  Skin: Negative for itching and rash.  Neurological: Negative for dizziness, focal weakness, seizures, loss of consciousness and headaches.       Denies TIAs, amaurosis fugax  Endo/Heme/Allergies: Does not bruise/bleed easily.  Psychiatric/Behavioral: The patient is not nervous/anxious.     Blood pressure 130/70, pulse 98, temperature 98.1 F (36.7 C), temperature source Oral, resp. rate 16, height 5' 2" (1.575 m), weight 130.2 kg, SpO2 98 %, unknown if currently breastfeeding. Physical Exam  Vitals reviewed. Constitutional: She is oriented to person, place, and time. She appears well-developed and well-nourished. No distress.  Severe obesity  HENT:  Head:  Normocephalic and atraumatic.  Right Ear: External ear normal.  Left Ear: External ear normal.  Eyes: Conjunctivae are normal. No scleral icterus.  Neck: Normal range of motion. Neck supple. No tracheal deviation present. No thyromegaly present.  Cardiovascular: Normal rate and normal heart sounds.  Respiratory: Effort normal and breath sounds normal. No stridor. No respiratory distress. She has no wheezes.  GI: Soft. She  exhibits no distension. There is no abdominal tenderness. There is no rebound.  Musculoskeletal:        General: No tenderness or edema.  Lymphadenopathy:    She has no cervical adenopathy.  Neurological: She is alert and oriented to person, place, and time. She exhibits normal muscle tone.  Skin: Skin is warm and dry. No rash noted. She is not diaphoretic. No erythema. No pallor.  Psychiatric: She has a normal mood and affect. Her behavior is normal. Judgment and thought content normal.     Assessment/Plan Severe obesity Chronic central low back pain  2 OR for laparoscopic sleeve gastrectomy, upper endoscopy  We reviewed her preoperative work-up.  We discussed the typical hospitalization.  We reviewed the typical postoperative recovery.  She has had her preoperative education class already.  We discussed constipation after surgery and some strategies to resolve that.  All of her questions were asked and answered  E ras protocol  IV antibiotic on-call  Leighton Ruff. Redmond Pulling, MD, FACS General, Bariatric, & Minimally Invasive Surgery [facility] [place]   Greer Pickerel, MD 07/08/2018, 12:02 PM

## 2018-07-08 NOTE — Transfer of Care (Signed)
Immediate Anesthesia Transfer of Care Note  Patient: Morgan Olson  Procedure(s) Performed: LAPAROSCOPIC GASTRIC SLEEVE RESECTION WITH HIATAL HERNIA REPAIR, UPPER ENDO, ERAS PATHWAY (N/A Abdomen)  Patient Location: PACU  Anesthesia Type:General  Level of Consciousness: awake, alert  and oriented  Airway & Oxygen Therapy: Patient Spontanous Breathing and Patient connected to face mask oxygen  Post-op Assessment: Report given to RN and Post -op Vital signs reviewed and stable  Post vital signs: Reviewed and stable  Last Vitals:  Vitals Value Taken Time  BP 156/102 07/08/18 1407  Temp    Pulse 77 07/08/18 1408  Resp 19 07/08/18 1408  SpO2 100 % 07/08/18 1408  Vitals shown include unvalidated device data.  Last Pain:  Vitals:   07/08/18 1121  TempSrc:   PainSc: 0-No pain         Complications: No apparent anesthesia complications

## 2018-07-08 NOTE — Anesthesia Postprocedure Evaluation (Signed)
Anesthesia Post Note  Patient: Morgan Olson  Procedure(s) Performed: LAPAROSCOPIC GASTRIC SLEEVE RESECTION WITH HIATAL HERNIA REPAIR, UPPER ENDO, ERAS PATHWAY (N/A Abdomen)     Patient location during evaluation: PACU Anesthesia Type: General Level of consciousness: awake and alert Pain management: pain level controlled Vital Signs Assessment: post-procedure vital signs reviewed and stable Respiratory status: spontaneous breathing, nonlabored ventilation, respiratory function stable and patient connected to nasal cannula oxygen Cardiovascular status: blood pressure returned to baseline and stable Postop Assessment: no apparent nausea or vomiting Anesthetic complications: no    Last Vitals:  Vitals:   07/08/18 1445 07/08/18 1500  BP: (!) 170/91 (!) 179/92  Pulse: 84 83  Resp: 16 18  Temp:    SpO2: 100% 100%    Last Pain:  Vitals:   07/08/18 1500  TempSrc:   PainSc: 5                  Taunia Frasco DAVID

## 2018-07-08 NOTE — Progress Notes (Signed)
Discussed post op day goals with patient including ambulation, IS, diet progression, pain, and nausea control.  BSTOP education provided including BSTOP flyer.  Questions answered. 

## 2018-07-08 NOTE — Op Note (Signed)
Preoperative diagnosis: laparoscopic sleeve gastrectomy  Postoperative diagnosis: Same   Procedure: Upper endoscopy   Surgeon: Clovis Riley, M.D.  Anesthesia: Gen.   Description of procedure: The endoscopy was placed in the mouth and into the oropharynx and under endoscopic vision it was advanced to the esophagogastric junction which was identified at 36cm from the teeth.  The pouch was tensely insufflated while the abdomen was flooded with irrigation to perform a leak test. No bubbles were seen.  The staple line is hemostatic. The lumen is evenly tubular with a gentle bend at the incisura which is easily traversed with the scope, no undue angulation or narrowing was present. The scope was withdrawn without difficulty.    Clovis Riley, M.D. General, Bariatric, & Minimally Invasive Surgery [facility]

## 2018-07-08 NOTE — Anesthesia Procedure Notes (Signed)
Procedure Name: Intubation Date/Time: 07/08/2018 12:42 PM Performed by: Maxwell Caul, CRNA Pre-anesthesia Checklist: Patient identified, Emergency Drugs available, Suction available and Patient being monitored Patient Re-evaluated:Patient Re-evaluated prior to induction Oxygen Delivery Method: Circle system utilized Preoxygenation: Pre-oxygenation with 100% oxygen Induction Type: IV induction Ventilation: Mask ventilation without difficulty Laryngoscope Size: Mac and 4 Grade View: Grade I Tube type: Oral Tube size: 7.0 mm Number of attempts: 1 Airway Equipment and Method: Stylet Placement Confirmation: ETT inserted through vocal cords under direct vision,  positive ETCO2 and breath sounds checked- equal and bilateral Secured at: 21 cm Tube secured with: Tape Dental Injury: Teeth and Oropharynx as per pre-operative assessment

## 2018-07-08 NOTE — Op Note (Signed)
07/08/2018 Morgan Olson 01/16/1991 [id number]   PRE-OPERATIVE DIAGNOSIS:  Severe obesity BMI 53  POST-OPERATIVE DIAGNOSIS:  Same + small hiatal hernia  PROCEDURE:  Procedure(s): LAPAROSCOPIC SLEEVE GASTRECTOMY WITH HIATAL HERNIA REPAIR UPPER GI ENDOSCOPY  SURGEON:  Surgeon(s): Atilano InaEric M Elky Funches, MD FACS FASMBS  ASSISTANTS: Phylliss Blakeshelsea Connor MD FACS  ANESTHESIA:   general  DRAINS: none   BOUGIE: 40 fr ViSiGi  LOCAL MEDICATIONS USED:   Exparel  EBL: minimal  SPECIMEN:  Source of Specimen:  Greater curvature of stomach  DISPOSITION OF SPECIMEN:  PATHOLOGY  COUNTS:  YES  INDICATION FOR PROCEDURE: This is a very pleasant 28 y.o.-year-old morbidly obese female who has had unsuccessful attempts for sustained weight loss. The patient presents today for a planned laparoscopic sleeve gastrectomy with upper endoscopy. We have discussed the risk and benefits of the procedure extensively preoperatively. Please see my separate notes.  PROCEDURE: After obtaining informed consent and receiving 5000 units of subcutaneous heparin, the patient was brought [facility] and placed supine on the operating room table. General endotracheal anesthesia was established. Sequential compression devices were placed. A orogastric tube was placed. The patient's abdomen was prepped and draped in the usual standard surgical fashion. The patient received preoperative IV antibiotic. A surgical timeout was performed. ERAS protocol used.   Access to the abdomen was achieved using a 5 mm 0 laparoscope thru a 5 mm trocar In the left upper Quadrant 2 fingerbreadths below the left subcostal margin using the Optiview technique. Pneumoperitoneum was smoothly established up to 15 mm of mercury. The laparoscope was advanced and the abdominal cavity was surveilled. The patient was then placed in reverse Trendelenburg.   A 5 mm trocar was placed slightly above and to the left of the umbilicus under  direct visualization.  [facility] was placed under the left lobe of the liver through a 5 mm trocar incision site in the subxiphoid position. A 5 mm trocar was placed in the lateral right upper quadrant along with a 15 mm trocar in the mid right abdomen. A final 5 mm trocar was placed in the lateral LUQ.  All under direct visualization after exparel had been infiltrated in bilateral lateral upper abdominal walls as a TAP block.  The stomach was inspected. It was completely decompressed and the orogastric tube was removed.  There is a small anterior dimple that was obviously visible. Her preop UGI showed no hiatal hernia.  The calibration tube was placed in the oropharynx and guided down into the stomach by the CRNA. 10 mL of air was insufflated into the calibration balloon. The calibration tubing was then gently pulled back by the CRNA and it slid past the GE junction. At this point the calibration tubing was desufflated and pulled back into the esophagus. This confirmed my suspicion of a clinically significant hiatal hernia. The gastrohepatic ligament was incised with harmonic scalpel. The right crus was identified. We identified the crossing fat along the right crus. The adipose tissue just above this area was incised with harmonic scalpel. I then bluntly dissected out this area and identified the left crus. There was evidence of a hiatal hernia. I then mobilized the esophagus. The left and right crus were further mobilized with blunt dissection. I was then able to reapproximate the left and right crus with 0 Ethibond using an Endostitch suture device and securing it with a titanium tyknot. We then had the CRNA readvanced the calibration tubing back into the stomach.  10 mL of air was insufflated into the calibration tube balloon. The calibration tube was then gently pulled back and there was resistance at the GE junction. The tube did not slide back up into the esophagus. At this point the  calibration tubing was deflated and removed from the patient's body.   We identified the pylorus and measured 6 cm proximal to the pylorus and identified an area of where we would start taking down the short gastric vessels. Harmonic scalpel was used to take down the short gastric vessels along the greater curvature of the stomach. We were able to enter the lesser sac. We continued to march along the greater curvature of the stomach taking down the short gastrics. As we approached the gastrosplenic ligament we took care in this area not to injure the spleen. We were able to take down the entire gastrosplenic ligament. We then mobilized the fundus away from the left crus of diaphragm. There were not any significant posterior gastric avascular attachments. This left the stomach completely mobilized. No vessels had been taken down along the lesser curvature of the stomach.  We then reidentified the pylorus. A 40Fr ViSiGi was then placed in the oropharynx and advanced down into the stomach and placed in the distal antrum and positioned along the lesser curvature. It was placed under suction which secured the 40Fr ViSiGi in place along the lesser curve. Then using the Ethicon echelon 60 mm stapler with a green load with Seamguard, I placed a stapler along the antrum approximately 5 cm from the pylorus. The stapler was angled so that there is ample room at the angularis incisura. I then fired the first staple load after inspecting it posteriorly to ensure adequate space both anteriorly and posteriorly. At this point I still was not completely past the angularis so with a gold load with Seamguard, I placed the stapler in position just inside the prior stapleline. We then rotated the stomach to insure that there was adequate anteriorly as well as posteriorly. The stapler was then fired.  At this point I started using 60 mm blue load staple cartridges with Seamguard. The echelon stapler was then repositioned with a 60  mm blue load with Seamguard and we continued to march up along the ViSiGi. My assistant was holding traction along the greater curvature stomach along the cauterized short gastric vessels ensuring that the stomach was symmetrically retracted. Prior to each firing of the staple, we rotated the stomach to ensure that there is adequate stomach left.  As we approached the fundus, I used 60 mm blue cartridge with Seamguard aiming  lateral to the GE junction after mobilizing some of the esophageal fat pad.  The sleeve was inspected. There is no evidence of cork screw. The staple line appeared hemostatic. The CRNA inflated the ViSiGi to the green zone and the upper abdomen was flooded with saline. There were no bubbles. The sleeve was decompressed and the ViSiGi removed. My assistant scrubbed out and performed an upper endoscopy. The sleeve easily distended with air and the scope was easily advanced to the pylorus. There is no evidence of internal bleeding or cork screwing. There was no narrowing at the angularis. There is no evidence of bubbles. Please see his operative note for further details. The gastric sleeve was decompressed and the endoscope was removed.  The greater curvature the stomach was grasped with a laparoscopic grasper and removed from the 15 mm trocar site.  The liver retractor was removed. I then closed the  15 mm trocar site with 1 interrupted 0 Vicryl sutures through the fascia using the endoclose. The closure was viewed laparoscopically and it was airtight. Remaining Exparel was then infiltrated in the preperitoneal spaces around the trocar sites. Pneumoperitoneum was released. All trocar sites were closed with a 4-0 Monocryl in a subcuticular fashion followed by the application of benzoin, steri-strips, and bandaids. The patient was extubated and taken to the recovery room in stable condition. All needle, instrument, and sponge counts were correct x2. There are no immediate complications  (1) 60 mm  green with Seamguard (1) 60 mm gold with seamguard (3) 60 mm blue with 2 seamguard  PLAN OF CARE: Admit to inpatient   PATIENT DISPOSITION:  PACU - hemodynamically stable.   Delay start of Pharmacological VTE agent (>24hrs) due to surgical blood loss or risk of bleeding:  no  Leighton Ruff. Redmond Pulling, MD, FACS FASMBS General, Bariatric, & Minimally Invasive Surgery [facility], [place]

## 2018-07-08 NOTE — Anesthesia Preprocedure Evaluation (Signed)
Anesthesia Evaluation  Patient identified by MRN, date of birth, ID band Patient awake    Reviewed: Allergy & Precautions, NPO status , Patient's Chart, lab work & pertinent test results  Airway Mallampati: I  TM Distance: >3 FB Neck ROM: Full    Dental   Pulmonary    Pulmonary exam normal        Cardiovascular Normal cardiovascular exam     Neuro/Psych Depression    GI/Hepatic   Endo/Other    Renal/GU      Musculoskeletal   Abdominal   Peds  Hematology   Anesthesia Other Findings   Reproductive/Obstetrics                             Anesthesia Physical Anesthesia Plan  ASA: III  Anesthesia Plan: General   Post-op Pain Management:    Induction: Intravenous  PONV Risk Score and Plan: 3 and Midazolam, Ondansetron and Treatment may vary due to age or medical condition  Airway Management Planned: Oral ETT  Additional Equipment:   Intra-op Plan:   Post-operative Plan: Extubation in OR  Informed Consent: I have reviewed the patients History and Physical, chart, labs and discussed the procedure including the risks, benefits and alternatives for the proposed anesthesia with the patient or authorized representative who has indicated his/her understanding and acceptance.       Plan Discussed with: CRNA and Surgeon  Anesthesia Plan Comments:         Anesthesia Quick Evaluation

## 2018-07-09 ENCOUNTER — Encounter (HOSPITAL_COMMUNITY): Payer: Self-pay | Admitting: General Surgery

## 2018-07-09 LAB — COMPREHENSIVE METABOLIC PANEL
ALT: 16 U/L (ref 0–44)
AST: 16 U/L (ref 15–41)
Albumin: 3.6 g/dL (ref 3.5–5.0)
Alkaline Phosphatase: 51 U/L (ref 38–126)
Anion gap: 11 (ref 5–15)
BUN: 10 mg/dL (ref 6–20)
CO2: 21 mmol/L — ABNORMAL LOW (ref 22–32)
Calcium: 8.5 mg/dL — ABNORMAL LOW (ref 8.9–10.3)
Chloride: 107 mmol/L (ref 98–111)
Creatinine, Ser: 0.79 mg/dL (ref 0.44–1.00)
GFR calc Af Amer: >60 mL/min (ref >60)
GFR calc non Af Amer: >60 mL/min (ref >60)
Glucose, Bld: 137 mg/dL — ABNORMAL HIGH (ref 70–99)
Potassium: 3.9 mmol/L (ref 3.5–5.1)
Sodium: 139 mmol/L (ref 135–145)
Total Bilirubin: 0.2 mg/dL — ABNORMAL LOW (ref 0.3–1.2)
Total Protein: 6.1 g/dL — ABNORMAL LOW (ref 6.5–8.1)

## 2018-07-09 LAB — CBC WITH DIFFERENTIAL/PLATELET
Abs Immature Granulocytes: 0.02 10*3/uL (ref 0.00–0.07)
Basophils Absolute: 0 10*3/uL (ref 0.0–0.1)
Basophils Relative: 0 %
Eosinophils Absolute: 0 10*3/uL (ref 0.0–0.5)
Eosinophils Relative: 0 %
HCT: 39.1 % (ref 36.0–46.0)
Hemoglobin: 12.7 g/dL (ref 12.0–15.0)
Immature Granulocytes: 0 %
Lymphocytes Relative: 9 %
Lymphs Abs: 0.7 10*3/uL (ref 0.7–4.0)
MCH: 28.3 pg (ref 26.0–34.0)
MCHC: 32.5 g/dL (ref 30.0–36.0)
MCV: 87.1 fL (ref 80.0–100.0)
Monocytes Absolute: 0.4 10*3/uL (ref 0.1–1.0)
Monocytes Relative: 6 %
Neutro Abs: 6.5 10*3/uL (ref 1.7–7.7)
Neutrophils Relative %: 85 %
Platelets: 240 10*3/uL (ref 150–400)
RBC: 4.49 MIL/uL (ref 3.87–5.11)
RDW: 13.9 % (ref 11.5–15.5)
WBC: 7.6 10*3/uL (ref 4.0–10.5)
nRBC: 0 % (ref 0.0–0.2)

## 2018-07-09 MED ORDER — OXYCODONE HCL 5 MG PO TABS: 5.0000 mg | ORAL_TABLET | Freq: Four times a day (QID) | ORAL | 0 refills | Status: DC | PRN

## 2018-07-09 MED ORDER — ACETAMINOPHEN 500 MG PO TABS: 1000.0000 mg | ORAL_TABLET | Freq: Three times a day (TID) | ORAL | 0 refills | Status: AC

## 2018-07-09 MED ORDER — ONDANSETRON 4 MG PO TBDP: 4.0000 mg | ORAL_TABLET | Freq: Four times a day (QID) | ORAL | 0 refills | Status: DC | PRN

## 2018-07-09 MED ORDER — POLYETHYLENE GLYCOL 3350 17 G PO PACK: 17.0000 g | PACK | Freq: Every day | ORAL | 0 refills | Status: DC

## 2018-07-09 MED ORDER — PANTOPRAZOLE SODIUM 40 MG PO TBEC: 40.0000 mg | DELAYED_RELEASE_TABLET | Freq: Every day | ORAL | 0 refills | Status: DC

## 2018-07-09 NOTE — Plan of Care (Signed)
Patient has met all goals for discharge. Discharge instructions given to patient. Patient had no questions. NT or writer will wheel patient out to the front of the building once family comes in  

## 2018-07-09 NOTE — Progress Notes (Signed)
Patient alert and oriented, Post op day 1.  Provided support and encouragement.  Encouraged pulmonary toilet, ambulation and small sips of liquids.  Completed 12 ounces of clear fluid started first 2 ounces of protein shake.  All questions answered.  Will continue to monitor.

## 2018-07-09 NOTE — Progress Notes (Signed)
Patient alert and oriented, pain is controlled. Patient is tolerating fluids, advanced to protein shake today, patient is tolerating well.  Reviewed Gastric sleeve discharge instructions with patient and patient is able to articulate understanding.  Provided information on BELT program, Support Group and [facility] outpatient pharmacy. All questions answered, will continue to monitor.  Total fluid intake 660 Call back one week post op 

## 2018-07-09 NOTE — Discharge Summary (Signed)
Physician Discharge Summary  Morgan Olson ASN:[id number] DOB: [birth year] DOA: 07/08/2018  PCP: Holli Humbles, MD  Admit date: 07/08/2018 Discharge date: 07/09/2018  Recommendations for Outpatient Follow-up:   Follow-up Information    Greer Pickerel, MD. Go on 08/07/2018.   Specialty: General Surgery Why: at [address] information: [address] [place] [address] [phone]        Carlena Hurl, PA-C. Go on 09/20/2018.   Specialty: General Surgery Why: at [address] information: [place] [address] [phone]          Discharge Diagnoses:  Active Problems:   S/P laparoscopic sleeve gastrectomy severe obesity  Surgical Procedure: Laparoscopic Sleeve Gastrectomy with hiatal hernia repair, upper endoscopy  Discharge Condition: Good Disposition: Home  Diet recommendation: Postoperative sleeve gastrectomy diet (liquids only)  Filed Weights   07/08/18 1100  Weight: 130.2 kg     Hospital Course:  The patient was admitted for a planned laparoscopic sleeve gastrectomy. Please see operative note. Preoperatively the patient was given 5000 units of subcutaneous heparin for DVT prophylaxis. Postoperative prophylactic Lovenox dosing was started on the evening of postoperative day 0. ERAS protocol was used. On the evening of postoperative day 0, the patient was started on water and ice chips. On postoperative day 1 the patient had no fever or tachycardia and was tolerating water in their diet was gradually advanced throughout the day. The patient was ambulating without difficulty. Their vital signs are stable without fever or tachycardia. Their hemoglobin had remained stable.  The patient had received discharge instructions and counseling. They were deemed stable for discharge and had met discharge criteria  BP (!) 144/88 (BP Location: Right Arm)   Pulse 66   Temp 98.7 F (37.1 C) (Oral)   Resp 18   Ht '5\' 2"'  (1.575 m)   Wt 130.2 kg    SpO2 100%   BMI 52.49 kg/m   Gen: alert, NAD, non-toxic appearing Pupils: equal, no scleral icterus Pulm: Lungs clear to auscultation, symmetric chest rise CV: regular rate and rhythm Abd: soft, min tender, nondistended.. No cellulitis. No incisional hernia Ext: no edema, no calf tenderness Skin: no rash, no jaundice  Discharge Instructions  Discharge Instructions    Ambulate hourly while awake   Complete by: As directed    Call MD for:  difficulty breathing, headache or visual disturbances   Complete by: As directed    Call MD for:  persistant dizziness or light-headedness   Complete by: As directed    Call MD for:  persistant nausea and vomiting   Complete by: As directed    Call MD for:  redness, tenderness, or signs of infection (pain, swelling, redness, odor or green/yellow discharge around incision site)   Complete by: As directed    Call MD for:  severe uncontrolled pain   Complete by: As directed    Call MD for:  temperature >101 F   Complete by: As directed    Diet bariatric full liquid   Complete by: As directed    Discharge instructions   Complete by: As directed    See bariatric discharge instructions   Incentive spirometry   Complete by: As directed    Perform hourly while awake     Allergies as of 07/09/2018   No Known Allergies     Medication List    TAKE these medications   acetaminophen 500 MG tablet Commonly known as: TYLENOL Take 2 tablets (1,000 mg total)  by mouth every 8 (eight) hours for 5 days. What changed:   when to take this  reasons to take this   Mirena (52 MG) 20 MCG/24HR IUD Generic drug: levonorgestrel 1 Device by Intrauterine route once. Notes to patient: Hormonal birth control will be ineffective for 30 days post surgery date.     ondansetron 4 MG disintegrating tablet Commonly known as: ZOFRAN-ODT Take 1 tablet (4 mg total) by mouth every 6 (six) hours as needed for nausea or vomiting.   oxyCODONE 5 MG immediate release  tablet Commonly known as: Oxy IR/ROXICODONE Take 1 tablet (5 mg total) by mouth every 6 (six) hours as needed for severe pain.   pantoprazole 40 MG tablet Commonly known as: PROTONIX Take 1 tablet (40 mg total) by mouth daily.   polyethylene glycol 17 g packet Commonly known as: MiraLax Take 17 g by mouth daily.   PREPARATION H EX Apply 1 application topically daily as needed (hemorrhoids).   sennosides-docusate sodium 8.6-50 MG tablet Commonly known as: SENOKOT-S Take 1 tablet by mouth 2 (two) times daily as needed for constipation.      Follow-up Information    Greer Pickerel, MD. Go on 08/07/2018.   Specialty: General Surgery Why: at [address] information: [address] [place] [address] [phone]        Carlena Hurl, PA-C. Go on 09/20/2018.   Specialty: General Surgery Why: at [address] information: [place] [address] [phone]            The results of significant diagnostics from this hospitalization (including imaging, microbiology, ancillary and laboratory) are listed below for reference.    Significant Diagnostic Studies: No results found.  Labs: Basic Metabolic Panel: Recent Labs  Lab 07/04/18 1522 07/09/18 0402  NA 139 139  K 3.9 3.9  CL 104 107  CO2 26 21*  GLUCOSE 81 137*  BUN 21* 10  CREATININE 0.77 0.79  CALCIUM 9.1 8.5*   Liver Function Tests: Recent Labs  Lab 07/04/18 1522 07/09/18 0402  AST 18 16  ALT 18 16  ALKPHOS 66 51  BILITOT 0.6 0.2*  PROT 7.4 6.1*  ALBUMIN 4.5 3.6    CBC: Recent Labs  Lab 07/04/18 1522 07/08/18 1421 07/09/18 0402  WBC 8.0  --  7.6  NEUTROABS 5.9  --  6.5  HGB 13.7 14.5 12.7  HCT 42.5 43.5 39.1  MCV 85.2  --  87.1  PLT 272  --  240    CBG: No results for input(s): GLUCAP in the last 168 hours.  Active Problems:   S/P laparoscopic sleeve gastrectomy   Time coordinating discharge: 15 min  Signed:  Gayland Curry, MD [facility], [place] [phone] 07/09/2018, 1:05 PM

## 2018-07-15 ENCOUNTER — Telehealth (HOSPITAL_COMMUNITY): Payer: Self-pay

## 2018-07-15 NOTE — Telephone Encounter (Signed)
Patient called to discuss post bariatric surgery follow up questions.  See below:   1.  Tell me about your pain and pain management?no medication needed  2.  Let's talk about fluid intake.  How much total fluid are you taking in?56 ounces   3.  How much protein have you taken in the last 2 days?57 grams of protein  4.  Have you had nausea?  Tell me about when have experienced nausea and what you did to help?taking once helped  5.  Has the frequency or color changed with your urine?urine light in color   6.  Tell me what your incisions look like?no problems  7.  Have you been passing gas? BM? Miralax x 2 had BM on 6/18 and 6/20  8.  If a problem or question were to arise who would you call?  Do you know contact numbers for [facility], [facility], and [facility]?aware of how to contact  9.  How has the walking going?walking every hour  10.  How are your vitamins and calcium going?  How are you taking them?taking both doesn't care for vitamins but taking them, calcium not problems  Reminded of goals and post op class with dietitians.

## 2018-07-23 ENCOUNTER — Encounter: Payer: BC Managed Care – PPO | Attending: General Surgery | Admitting: Skilled Nursing Facility1

## 2018-07-23 ENCOUNTER — Other Ambulatory Visit: Payer: Self-pay

## 2018-07-23 DIAGNOSIS — E669 Obesity, unspecified: Secondary | ICD-10-CM | POA: Insufficient documentation

## 2018-07-24 NOTE — Progress Notes (Signed)
2 Week Post-Operative Nutrition Class   Patient was seen on 03/19/18 for Post-Operative Nutrition education at the [facility].    Surgery date: 07/08/2018 Surgery type: sleeve Start weight at [facility]: 287.5 Weight today: 275.8   Body Composition Scale 07/24/2018  Total Body Fat % 47.4  Visceral Fat 14  Fat-Free Mass % 52.5   Total Body Water % 40.7   Muscle-Mass lbs 32.1  Body Fat Displacement          Torso  lbs 81.2         Left Leg  lbs 16.2         Right Leg  lbs 16.2         Left Arm  lbs 8.1         Right Arm   lbs 8.1     The following the learning objectives were met by the patient during this course:  Identifies Phase 3 (Soft, High Proteins) Dietary Goals and will begin from 2 weeks post-operatively to 2 months post-operatively  Identifies appropriate sources of fluids and proteins   States protein recommendations and appropriate sources post-operatively  Identifies the need for appropriate texture modifications, mastication, and bite sizes when consuming solids  Identifies appropriate multivitamin and calcium sources post-operatively  Describes the need for physical activity post-operatively and will follow MD recommendations  States when to call healthcare provider regarding medication questions or post-operative complications   Handouts given during class include:  Phase 3A: Soft, High Protein Diet Handout   Follow-Up Plan: Patient will follow-up at [facility] in 6 weeks for 2 month post-op nutrition visit for diet advancement per MD.

## 2018-07-29 ENCOUNTER — Telehealth: Payer: Self-pay | Admitting: Skilled Nursing Facility1

## 2018-07-29 NOTE — Telephone Encounter (Signed)
RD called pt to verify fluid intake once starting soft, solid proteins 2 week post-bariatric surgery.   Daily Fluid intake: 64 Daily Protein intake: 60  Concerns/issues:   1 occurrence of reflux in the middle of the night after taco seasoned hamburger meat.

## 2018-09-03 ENCOUNTER — Ambulatory Visit: Payer: BC Managed Care – PPO | Admitting: Dietician

## 2018-09-04 ENCOUNTER — Encounter: Payer: BC Managed Care – PPO | Attending: General Surgery | Admitting: Dietician

## 2018-09-04 ENCOUNTER — Encounter: Payer: Self-pay | Admitting: Dietician

## 2018-09-04 DIAGNOSIS — E669 Obesity, unspecified: Secondary | ICD-10-CM

## 2018-09-04 NOTE — Progress Notes (Signed)
Bariatric Follow-Up Visit Medical Nutrition Therapy  Appt Start Time: 3:15pm  End Time: 3:45pm  MyChart Visit This visit was completed via MyChart due to the COVID-19 pandemic.   I spoke with Morgan Olson and verified that I was speaking with the correct person with two patient identifiers (full name and date of birth).   I discussed the limitations related to this kind of visit and the patient is willing to proceed.   2 Months Post-Operative Sleeve Gastrectomy Surgery Surgery Date: 07/08/2018   NUTRITION ASSESSMENT  Anthropometrics  Start weight at [facility]: 287.5 lbs (12/14/2017) Today's weight: N/A Total weight lost: N/A   Body Composition Scale 07/24/2018 09/04/2018  Total Body Fat % 47.4 N/A  Visceral Fat 14   Fat-Free Mass % 52.5    Total Body Water % 40.7    Muscle-Mass lbs 32.1   Body Fat Displacement           Torso  lbs 81.2          Left Leg  lbs 16.2          Right Leg  lbs 16.2          Left Arm  lbs 8.1          Right Arm   lbs 8.1     Clinical Medical Hx: obesity  Medications: N/A  Food & Nutrition Related Hx Dietary Hx: Eating primarily protein foods, no intolerances. Not a meat eater before, struggling a little with how much to eat and needs ideas (getting tired of eating primarily dairy foods.) Asked about recommended ranges for calories and carbohydrates.  Estimated Daily Fluid Intake: 50 oz Estimated Daily Protein Intake: 60+ g  Physical Activity  Current average weekly physical activity: ADLs, walking    Post-Op Goals Using straws: no Drinking while eating: no Chewing/swallowing difficulties: no Changes in vision: no Changes to mood/headaches: no Hair loss/changes to skin/nails: no Difficulty focusing/concentrating: no Sweating: no Dizziness/lightheadedness: no Palpitations: no  Carbonated/caffeinated beverages: no N/V/D/C/Gas: no Abdominal pain: no Dumping syndrome: no   NUTRITION DIAGNOSIS  Overweight/obesity (Pony-3.3) related to past  poor dietary habits and physical inactivity as evidenced by patient w/ completed Sleeve Gastrectomy surgery following dietary guidelines for continued weight loss.   NUTRITION INTERVENTION Nutrition counseling (C-1) and education (E-2) to facilitate bariatric surgery goals, including: . Diet advancement to the next phase (phase IV) now including non-starchy vegetables . The importance of consuming adequate calories as well as certain nutrients daily due to the body's need for essential vitamins, minerals, and fats . The importance of daily physical activity and to reach a goal of at least 150 minutes of moderate to vigorous physical activity weekly (or as directed by their physician) due to benefits such as increased musculature and improved lab values  Handouts Provided Include   Phase IV: Protein + Non-Starchy Vegetables  Plant Protein   Learning Style & Readiness for Change Teaching method utilized: Visual & Auditory  Demonstrated degree of understanding via: Teach Back  Barriers to learning/adherence to lifestyle change: None Identified    MONITORING & EVALUATION Dietary intake, weekly physical activity, body weight, and goals in about 4 months.  Next Steps Patient is to follow-up in about 4 months for 6 month post-op class.

## 2018-12-03 ENCOUNTER — Encounter: Payer: BC Managed Care – PPO | Attending: General Surgery | Admitting: Skilled Nursing Facility1

## 2018-12-03 DIAGNOSIS — E669 Obesity, unspecified: Secondary | ICD-10-CM | POA: Insufficient documentation

## 2018-12-03 NOTE — Progress Notes (Signed)
Bariatric Follow-Up Visit Medical Nutrition Therapy  Appt Start Time: 3:15pm  End Time: 3:45pm  MyChart Visit This visit was completed via MyChart due to the COVID-19 pandemic.   I spoke with Morgan Olson and verified that I was speaking with the correct person with two patient identifiers (full name and date of birth).   I discussed the limitations related to this kind of visit and the patient is willing to proceed.  NUTRITION ASSESSMENT  Anthropometrics  Start weight at [facility]: 287.5 lbs (12/14/2017) Today's weight: N/A Total weight lost: N/A   Body Composition Scale 07/24/2018 09/04/2018  Total Body Fat % 47.4 N/A  Visceral Fat 14   Fat-Free Mass % 52.5    Total Body Water % 40.7    Muscle-Mass lbs 32.1   Body Fat Displacement           Torso  lbs 81.2          Left Leg  lbs 16.2          Right Leg  lbs 16.2          Left Arm  lbs 8.1          Right Arm   lbs 8.1     More trouble with meats  Clinical Medical Hx: obesity  Medications: N/A  Food & Nutrition Related Hx Dietary Hx: Eating primarily protein foods, no intolerances. Not a meat eater before, struggling a little with how much to eat and needs ideas (getting tired of eating primarily dairy foods.) Asked about recommended ranges for calories and carbohydrates.  24 hr recall  Scrambled egg with salsa and sausage [place] yogurt wendys chilli or chicken nuggets or Meatballs in vegetable pasta with cheese Quest protein chips or apple with peanutbutter   Estimated Daily Fluid Intake: 50 oz Estimated Daily Protein Intake: 60+ g  Physical Activity  Current average weekly physical activity: ADLs, walking    Post-Op Goals Using straws: no Drinking while eating: no Chewing/swallowing difficulties: no Changes in vision: no Changes to mood/headaches: no Hair loss/changes to skin/nails: no Difficulty focusing/concentrating: no Sweating: no Dizziness/lightheadedness: no Palpitations: no  Carbonated/caffeinated  beverages: no N/V/D/C/Gas: no Abdominal pain: no Dumping syndrome: no   NUTRITION DIAGNOSIS  Overweight/obesity ([place]-3.3) related to past poor dietary habits and physical inactivity as evidenced by patient w/ completed Sleeve Gastrectomy surgery following dietary guidelines for continued weight loss.   NUTRITION INTERVENTION Nutrition counseling (C-1) and education (E-2) to facilitate bariatric surgery goals, including: . Diet advancement to the next phase (phase IV) now including starchy vegetables . The importance of consuming adequate calories as well as certain nutrients daily due to the body's need for essential vitamins, minerals, and fats . The importance of daily physical activity and to reach a goal of at least 150 minutes of moderate to vigorous physical activity weekly (or as directed by their physician) due to benefits such as increased musculature and improved lab values  Handouts Provided Include   Meal ideas  Plant Protein   Learning Style & Readiness for Change Teaching method utilized: Visual & Auditory  Demonstrated degree of understanding via: Teach Back  Barriers to learning/adherence to lifestyle change: None Identified    MONITORING & EVALUATION Dietary intake, weekly physical activity, body weight, and goals in about 4 months.  Next Steps Patient is to follow-up in about 3 months for 9 month post-op class.

## 2019-01-22 ENCOUNTER — Ambulatory Visit: Payer: BC Managed Care – PPO | Attending: Internal Medicine

## 2019-01-22 ENCOUNTER — Other Ambulatory Visit: Payer: Self-pay

## 2019-01-22 DIAGNOSIS — Z20822 Contact with and (suspected) exposure to covid-19: Secondary | ICD-10-CM

## 2019-01-24 LAB — NOVEL CORONAVIRUS, NAA: SARS-CoV-2, NAA: NOT DETECTED

## 2019-02-06 ENCOUNTER — Ambulatory Visit: Payer: Self-pay | Attending: Internal Medicine

## 2019-02-06 ENCOUNTER — Other Ambulatory Visit: Payer: Self-pay

## 2019-02-06 DIAGNOSIS — Z20822 Contact with and (suspected) exposure to covid-19: Secondary | ICD-10-CM

## 2019-02-07 LAB — NOVEL CORONAVIRUS, NAA: SARS-CoV-2, NAA: NOT DETECTED

## 2019-03-04 ENCOUNTER — Telehealth: Payer: BC Managed Care – PPO | Admitting: Skilled Nursing Facility1

## 2019-04-09 ENCOUNTER — Other Ambulatory Visit: Payer: Self-pay

## 2019-04-09 ENCOUNTER — Ambulatory Visit: Payer: BC Managed Care – PPO | Attending: Internal Medicine

## 2019-04-09 DIAGNOSIS — Z20822 Contact with and (suspected) exposure to covid-19: Secondary | ICD-10-CM

## 2019-04-10 LAB — NOVEL CORONAVIRUS, NAA: SARS-CoV-2, NAA: NOT DETECTED

## 2019-06-05 ENCOUNTER — Other Ambulatory Visit: Payer: Self-pay | Admitting: Neurological Surgery

## 2019-06-26 NOTE — Pre-Procedure Instructions (Signed)
Kimberla Driskill  06/26/2019      [facility] [address] [place] [address] [place] [address] Phone: [phone] Fax: [phone]    Your procedure is scheduled on- 07/01/19.  Report to [facility] Admitting at 530 A.M.  Call this number if you have problems the morning of surgery:  [phone]   Remember:  Do not eat or drink after midnight.   Take these medicines the morning of surgery with A SIP OF WATER ----COLACE    Do not wear jewelry, make-up or nail polish.  Do not wear lotions, powders, or perfumes, or deodorant.  Do not shave 48 hours prior to surgery.  Men may shave face and neck.  Do not bring valuables to the hospital.  [facility] is not responsible for any belongings or valuables.  Contacts, dentures or bridgework may not be worn into surgery.  Leave your suitcase in the car.  After surgery it may be brought to your room.  For patients admitted to the hospital, discharge time will be determined by your treatment team.  Patients discharged the day of surgery will not be allowed to drive home.    Special instructions:  Do not take any aspirin,anti-inflammatories,vitamins,or herbal supplements 5-7 days prior to surgery.[facility] - Preparing for Surgery  Before surgery, you can play an important role.  Because skin is not sterile, your skin needs to be as free of germs as possible.  You can reduce the number of germs on you skin by washing with CHG (chlorahexidine gluconate) soap before surgery.  CHG is an antiseptic cleaner which kills germs and bonds with the skin to continue killing germs even after washing.  Oral Hygiene is also important in reducing the risk of infection.  Remember to brush your teeth with your regular toothpaste the morning of surgery.  Please DO NOT use if you have an allergy to CHG or antibacterial soaps.  If your skin becomes reddened/irritated stop using the CHG and inform your nurse when you arrive at Short  Stay.  Do not shave (including legs and underarms) for at least 48 hours prior to the first CHG shower.  You may shave your face.  Please follow these instructions carefully:   1.  Shower with CHG Soap the night before surgery and the morning of Surgery.  2.  If you choose to wash your hair, wash your hair first as usual with your normal shampoo.  3.  After you shampoo, rinse your hair and body thoroughly to remove the shampoo. 4.  Use CHG as you would any other liquid soap.  You can apply chg directly to the skin and wash gently with a      scrungie or washcloth.           5.  Apply the CHG Soap to your body ONLY FROM THE NECK DOWN.   Do not use on open wounds or open sores. Avoid contact with your eyes, ears, mouth and genitals (private parts).  Wash genitals (private parts) with your normal soap.  6.  Wash thoroughly, paying special attention to the area where your surgery will be performed.  7.  Thoroughly rinse your body with warm water from the neck down.  8.  DO NOT shower/wash with your normal soap after using and rinsing off the CHG Soap.  9.  Pat yourself dry with a clean towel.  10.  Wear clean pajamas.            11.  Place clean sheets on your bed the night of your first shower and do not sleep with pets.  Day of Surgery  Do not apply any lotions/deoderants the morning of surgery.   Please wear clean clothes to the hospital/surgery center. Remember to brush your teeth with toothpaste.    Please read over the following fact sheets that you were given. Coughing and Deep Breathing

## 2019-06-26 NOTE — Pre-Procedure Instructions (Addendum)
Your procedure is scheduled on July 01, 2019 from 07:30 AM- 11:04 AM.  Report to [facility] Entrance "A" at 05:30 A.M., and check in at the Admitting office.  Call this number if you have problems the morning of surgery:  [phone]  Call [phone] if you have any questions prior to your surgery date Monday-Friday 8am-4pm.    Remember:  Do not eat or drink after midnight the night before your surgery.     Take these medicines the morning of surgery with A SIP OF WATER: SPRINTEC  amoxicillin-clavulanate (AUGMENTIN)  IF NEEDED: Dextromethorphan-guaiFENesin (MUCUS RELIEF DM PO) sodium chloride (OCEAN) nasal spray   As of today, STOP taking any Aspirin (unless otherwise instructed by your surgeon) and Aspirin containing products, diclofenac (VOLTAREN), Aleve, Naproxen, Ibuprofen, Motrin, Advil, Goody's, BC's, all herbal medications, fish oil, and all vitamins.          The Morning of Surgery:            Do not wear jewelry, make up, or nail polish.            Do not wear lotions, powders, perfumes, or deodorant.            Do not shave 48 hours prior to surgery.              Do not bring valuables to the hospital.            [facility] is not responsible for any belongings or valuables.  Do NOT Smoke (Tobacco/Vapping) or drink Alcohol 24 hours prior to your procedure.  If you use a CPAP at night, you may bring all equipment for your overnight stay.   Contacts, glasses, dentures or bridgework may not be worn into surgery.      For patients admitted to the hospital, discharge time will be determined by your treatment team.   Patients discharged the day of surgery will not be allowed to drive home, and someone needs to stay with them for 24 hours.    Special instructions:   [facility]- Preparing For Surgery  Before surgery, you can play an important role. Because skin is not sterile, your skin needs to be as free of germs as possible. You can reduce the number of  germs on your skin by washing with CHG (chlorahexidine gluconate) Soap before surgery.  CHG is an antiseptic cleaner which kills germs and bonds with the skin to continue killing germs even after washing.    Oral Hygiene is also important to reduce your risk of infection.  Remember - BRUSH YOUR TEETH THE MORNING OF SURGERY WITH YOUR REGULAR TOOTHPASTE  Please do not use if you have an allergy to CHG or antibacterial soaps. If your skin becomes reddened/irritated stop using the CHG.  Do not shave (including legs and underarms) for at least 48 hours prior to first CHG shower. It is OK to shave your face.  Please follow these instructions carefully.   1. Shower the NIGHT BEFORE SURGERY and the MORNING OF SURGERY with CHG Soap.   2. If you chose to wash your hair, wash your hair first as usual with your normal shampoo.  3. After you shampoo, rinse your hair and body thoroughly to remove the shampoo.  4. Use CHG as you would any other liquid soap. You can apply CHG directly to the skin and wash gently with a scrungie or a clean washcloth.   5. Apply the CHG Soap to your body ONLY  FROM THE NECK DOWN.  Do not use on open wounds or open sores. Avoid contact with your eyes, ears, mouth and genitals (private parts). Wash Face and genitals (private parts)  with your normal soap.   6. Wash thoroughly, paying special attention to the area where your surgery will be performed.  7. Thoroughly rinse your body with warm water from the neck down.  8. DO NOT shower/wash with your normal soap after using and rinsing off the CHG Soap.  9. Pat yourself dry with a CLEAN TOWEL.  10. Wear CLEAN PAJAMAS to bed the night before surgery, wear comfortable clothes the morning of surgery  11. Place CLEAN SHEETS on your bed the night of your first shower and DO NOT SLEEP WITH PETS.   Day of Surgery: Shower with CHG Soap.  Do not apply any deodorants/lotions.  Please wear clean clothes to the hospital/surgery  center.   Remember to brush your teeth WITH YOUR REGULAR TOOTHPASTE.   Please read over the following fact sheets that you were given.

## 2019-06-27 ENCOUNTER — Encounter (HOSPITAL_COMMUNITY)
Admission: RE | Admit: 2019-06-27 | Discharge: 2019-06-27 | Disposition: A | Discharge status: Home / Self Care | Payer: BC Managed Care – PPO | Source: Ambulatory Visit | Attending: Neurological Surgery | Admitting: Neurological Surgery

## 2019-06-27 ENCOUNTER — Other Ambulatory Visit: Payer: Self-pay

## 2019-06-27 ENCOUNTER — Other Ambulatory Visit (HOSPITAL_COMMUNITY)
Admission: RE | Admit: 2019-06-27 | Discharge: 2019-06-27 | Disposition: A | Discharge status: Home / Self Care | Payer: BC Managed Care – PPO | Source: Ambulatory Visit | Attending: Neurological Surgery | Admitting: Neurological Surgery

## 2019-06-27 ENCOUNTER — Encounter (HOSPITAL_COMMUNITY): Payer: Self-pay

## 2019-06-27 DIAGNOSIS — Z01812 Encounter for preprocedural laboratory examination: Secondary | ICD-10-CM | POA: Diagnosis not present

## 2019-06-27 DIAGNOSIS — Z20822 Contact with and (suspected) exposure to covid-19: Secondary | ICD-10-CM | POA: Insufficient documentation

## 2019-06-27 HISTORY — DX: Personal history of other diseases of the digestive system: Z87.19

## 2019-06-27 HISTORY — DX: Anxiety disorder, unspecified: F41.9

## 2019-06-27 LAB — TYPE AND SCREEN
ABO/RH(D): A POS
Antibody Screen: NEGATIVE

## 2019-06-27 LAB — CBC
HCT: 42.4 % (ref 36.0–46.0)
Hemoglobin: 13.7 g/dL (ref 12.0–15.0)
MCH: 28.8 pg (ref 26.0–34.0)
MCHC: 32.3 g/dL (ref 30.0–36.0)
MCV: 89.3 fL (ref 80.0–100.0)
Platelets: 276 10*3/uL (ref 150–400)
RBC: 4.75 MIL/uL (ref 3.87–5.11)
RDW: 13.1 % (ref 11.5–15.5)
WBC: 5.6 10*3/uL (ref 4.0–10.5)
nRBC: 0 % (ref 0.0–0.2)

## 2019-06-27 LAB — ABO/RH: ABO/RH(D): A POS

## 2019-06-27 LAB — SURGICAL PCR SCREEN
MRSA, PCR: NEGATIVE
Staphylococcus aureus: NEGATIVE

## 2019-06-27 LAB — SARS CORONAVIRUS 2 (TAT 6-24 HRS): SARS Coronavirus 2: NEGATIVE

## 2019-06-27 NOTE — Progress Notes (Signed)
PCP - Lysle Rubens, MD Cardiologist - Denies  PPM/ICD - Denies  Chest x-ray - N/A EKG - N/A Stress Test - Denies  ECHO - Denies Cardiac Cath - Denies  Sleep Study - Denies  Patient denies being a diabetic.  Blood Thinner Instructions: N/A Aspirin Instructions: N/A  ERAS Protcol - No PRE-SURGERY Ensure or G2- N/A  COVID TEST- 06/27/19 @ 1225 @ [facility]   Anesthesia review: No.  Patient denies shortness of breath, fever, cough and chest pain at PAT appointment   All instructions explained to the patient, with a verbal understanding of the material. Patient agrees to go over the instructions while at home for a better understanding. Patient also instructed to self quarantine after being tested for COVID-19. The opportunity to ask questions was provided.

## 2019-07-01 ENCOUNTER — Inpatient Hospital Stay (HOSPITAL_COMMUNITY): Payer: BC Managed Care – PPO | Admitting: Anesthesiology

## 2019-07-01 ENCOUNTER — Encounter (HOSPITAL_COMMUNITY): Payer: Self-pay | Admitting: Neurological Surgery

## 2019-07-01 ENCOUNTER — Inpatient Hospital Stay (HOSPITAL_COMMUNITY): Payer: BC Managed Care – PPO

## 2019-07-01 ENCOUNTER — Inpatient Hospital Stay (HOSPITAL_COMMUNITY)
Admission: RE | Admit: 2019-07-01 | Discharge: 2019-07-03 | DRG: 454 | Disposition: A | Discharge status: Home / Self Care | Payer: BC Managed Care – PPO | Attending: Neurological Surgery | Admitting: Neurological Surgery

## 2019-07-01 ENCOUNTER — Encounter (HOSPITAL_COMMUNITY)
Admission: RE | Disposition: A | Discharge status: Home / Self Care | Payer: Self-pay | Source: Home / Self Care | Attending: Neurological Surgery

## 2019-07-01 ENCOUNTER — Other Ambulatory Visit: Payer: Self-pay

## 2019-07-01 DIAGNOSIS — M4317 Spondylolisthesis, lumbosacral region: Secondary | ICD-10-CM | POA: Diagnosis present

## 2019-07-01 DIAGNOSIS — M48062 Spinal stenosis, lumbar region with neurogenic claudication: Secondary | ICD-10-CM | POA: Diagnosis present

## 2019-07-01 DIAGNOSIS — Z6841 Body Mass Index (BMI) 40.0 and over, adult: Secondary | ICD-10-CM

## 2019-07-01 DIAGNOSIS — G8929 Other chronic pain: Secondary | ICD-10-CM | POA: Diagnosis present

## 2019-07-01 DIAGNOSIS — M62830 Muscle spasm of back: Secondary | ICD-10-CM | POA: Diagnosis not present

## 2019-07-01 DIAGNOSIS — M5417 Radiculopathy, lumbosacral region: Secondary | ICD-10-CM | POA: Diagnosis present

## 2019-07-01 DIAGNOSIS — M4316 Spondylolisthesis, lumbar region: Secondary | ICD-10-CM | POA: Diagnosis present

## 2019-07-01 DIAGNOSIS — Z419 Encounter for procedure for purposes other than remedying health state, unspecified: Secondary | ICD-10-CM

## 2019-07-01 LAB — POCT PREGNANCY, URINE: Preg Test, Ur: NEGATIVE

## 2019-07-01 SURGERY — POSTERIOR LUMBAR FUSION 1 LEVEL
Anesthesia: General | Site: Spine Lumbar

## 2019-07-01 MED ORDER — DOCUSATE SODIUM 100 MG PO CAPS: 100.0000 mg | ORAL_CAPSULE | Freq: Two times a day (BID) | ORAL | Status: DC | PRN

## 2019-07-01 MED ORDER — BUPIVACAINE HCL (PF) 0.5 % IJ SOLN
INTRAMUSCULAR | Status: DC | PRN
  Administered 2019-07-01: 5 mL
  Administered 2019-07-01: 25 mL

## 2019-07-01 MED ORDER — MENTHOL 3 MG MT LOZG: 1.0000 | LOZENGE | OROMUCOSAL | Status: DC | PRN

## 2019-07-01 MED ORDER — SALINE SPRAY 0.65 % NA SOLN
1.0000 | Freq: Two times a day (BID) | NASAL | Status: DC | PRN
  Filled 2019-07-01: qty 44

## 2019-07-01 MED ORDER — DEXAMETHASONE SODIUM PHOSPHATE 10 MG/ML IJ SOLN
INTRAMUSCULAR | Status: DC | PRN
  Administered 2019-07-01: 10 mg via INTRAVENOUS

## 2019-07-01 MED ORDER — ONDANSETRON HCL 4 MG PO TABS
4.0000 mg | ORAL_TABLET | Freq: Four times a day (QID) | ORAL | Status: DC | PRN
  Filled 2019-07-01: qty 1

## 2019-07-01 MED ORDER — FENTANYL CITRATE (PF) 100 MCG/2ML IJ SOLN
INTRAMUSCULAR | Status: AC
  Filled 2019-07-01: qty 2

## 2019-07-01 MED ORDER — MIDAZOLAM HCL 2 MG/2ML IJ SOLN
INTRAMUSCULAR | Status: AC
  Filled 2019-07-01: qty 2

## 2019-07-01 MED ORDER — THROMBIN 5000 UNITS EX SOLR
OROMUCOSAL | Status: DC | PRN
  Administered 2019-07-01: 5 mL via TOPICAL

## 2019-07-01 MED ORDER — FLEET ENEMA 7-19 GM/118ML RE ENEM: 1.0000 | ENEMA | Freq: Once | RECTAL | Status: DC | PRN

## 2019-07-01 MED ORDER — FENTANYL CITRATE (PF) 250 MCG/5ML IJ SOLN
INTRAMUSCULAR | Status: DC | PRN
  Administered 2019-07-01: 50 ug via INTRAVENOUS
  Administered 2019-07-01: 100 ug via INTRAVENOUS
  Administered 2019-07-01: 50 ug via INTRAVENOUS
  Administered 2019-07-01: 100 ug via INTRAVENOUS
  Administered 2019-07-01 (×4): 50 ug via INTRAVENOUS

## 2019-07-01 MED ORDER — CEFAZOLIN SODIUM-DEXTROSE 2-4 GM/100ML-% IV SOLN
2.0000 g | INTRAVENOUS | Status: AC
  Administered 2019-07-01: 2 g via INTRAVENOUS
  Filled 2019-07-01: qty 100

## 2019-07-01 MED ORDER — MORPHINE SULFATE (PF) 4 MG/ML IV SOLN
INTRAVENOUS | Status: AC
  Administered 2019-07-01: 4 mg
  Filled 2019-07-01: qty 1

## 2019-07-01 MED ORDER — ONDANSETRON HCL 4 MG/2ML IJ SOLN
INTRAMUSCULAR | Status: AC
  Filled 2019-07-01: qty 2

## 2019-07-01 MED ORDER — ONDANSETRON HCL 4 MG/2ML IJ SOLN: 4.0000 mg | Freq: Four times a day (QID) | INTRAMUSCULAR | Status: DC | PRN

## 2019-07-01 MED ORDER — MIDAZOLAM HCL 2 MG/2ML IJ SOLN
INTRAMUSCULAR | Status: DC | PRN
  Administered 2019-07-01: 2 mg via INTRAVENOUS

## 2019-07-01 MED ORDER — OXYCODONE HCL 5 MG/5ML PO SOLN: 5.0000 mg | Freq: Once | ORAL | Status: DC | PRN

## 2019-07-01 MED ORDER — ONDANSETRON HCL 4 MG/2ML IJ SOLN
INTRAMUSCULAR | Status: DC | PRN
  Administered 2019-07-01: 4 mg via INTRAVENOUS

## 2019-07-01 MED ORDER — LIDOCAINE-EPINEPHRINE 1 %-1:100000 IJ SOLN
INTRAMUSCULAR | Status: AC
  Filled 2019-07-01: qty 1

## 2019-07-01 MED ORDER — CEFAZOLIN SODIUM-DEXTROSE 2-4 GM/100ML-% IV SOLN
2.0000 g | Freq: Three times a day (TID) | INTRAVENOUS | Status: AC
  Administered 2019-07-01 – 2019-07-02 (×2): 2 g via INTRAVENOUS
  Filled 2019-07-01 (×2): qty 100

## 2019-07-01 MED ORDER — CHLORHEXIDINE GLUCONATE 0.12 % MT SOLN
15.0000 mL | Freq: Once | OROMUCOSAL | Status: AC
  Administered 2019-07-01: 15 mL via OROMUCOSAL
  Filled 2019-07-01: qty 15

## 2019-07-01 MED ORDER — SODIUM CHLORIDE 0.9 % IV SOLN
INTRAVENOUS | Status: DC | PRN
  Administered 2019-07-01: 500 mL

## 2019-07-01 MED ORDER — CHLORHEXIDINE GLUCONATE CLOTH 2 % EX PADS: 6.0000 | MEDICATED_PAD | Freq: Once | CUTANEOUS | Status: DC

## 2019-07-01 MED ORDER — HYDROXYZINE HCL 50 MG/ML IM SOLN
50.0000 mg | Freq: Four times a day (QID) | INTRAMUSCULAR | Status: DC | PRN
  Administered 2019-07-01: 50 mg via INTRAMUSCULAR
  Filled 2019-07-01: qty 1

## 2019-07-01 MED ORDER — ROCURONIUM BROMIDE 10 MG/ML (PF) SYRINGE
PREFILLED_SYRINGE | INTRAVENOUS | Status: DC | PRN
  Administered 2019-07-01: 30 mg via INTRAVENOUS
  Administered 2019-07-01: 70 mg via INTRAVENOUS
  Administered 2019-07-01: 10 mg via INTRAVENOUS
  Administered 2019-07-01: 40 mg via INTRAVENOUS

## 2019-07-01 MED ORDER — FENTANYL CITRATE (PF) 250 MCG/5ML IJ SOLN
INTRAMUSCULAR | Status: AC
  Filled 2019-07-01: qty 5

## 2019-07-01 MED ORDER — THROMBIN 20000 UNITS EX SOLR
CUTANEOUS | Status: AC
  Filled 2019-07-01: qty 20000

## 2019-07-01 MED ORDER — KETOROLAC TROMETHAMINE 15 MG/ML IJ SOLN
INTRAMUSCULAR | Status: AC
  Filled 2019-07-01: qty 1

## 2019-07-01 MED ORDER — SENNA 8.6 MG PO TABS
1.0000 | ORAL_TABLET | Freq: Two times a day (BID) | ORAL | Status: DC
  Administered 2019-07-01 – 2019-07-03 (×5): 8.6 mg via ORAL
  Filled 2019-07-01 (×5): qty 1

## 2019-07-01 MED ORDER — LACTATED RINGERS IV SOLN: INTRAVENOUS | Status: DC | PRN

## 2019-07-01 MED ORDER — ONDANSETRON HCL 4 MG/2ML IJ SOLN
4.0000 mg | Freq: Once | INTRAMUSCULAR | Status: AC | PRN
  Administered 2019-07-01: 4 mg via INTRAVENOUS

## 2019-07-01 MED ORDER — ROCURONIUM BROMIDE 10 MG/ML (PF) SYRINGE
PREFILLED_SYRINGE | INTRAVENOUS | Status: AC
  Filled 2019-07-01: qty 10

## 2019-07-01 MED ORDER — OXYCODONE-ACETAMINOPHEN 5-325 MG PO TABS
1.0000 | ORAL_TABLET | ORAL | Status: DC | PRN
  Administered 2019-07-01 – 2019-07-02 (×8): 2 via ORAL
  Filled 2019-07-01 (×8): qty 2

## 2019-07-01 MED ORDER — EPHEDRINE SULFATE-NACL 50-0.9 MG/10ML-% IV SOSY
PREFILLED_SYRINGE | INTRAVENOUS | Status: DC | PRN
  Administered 2019-07-01: 5 mg via INTRAVENOUS

## 2019-07-01 MED ORDER — NORGESTIMATE-ETH ESTRADIOL 0.25-35 MG-MCG PO TABS: 1.0000 | ORAL_TABLET | Freq: Every day | ORAL | Status: DC

## 2019-07-01 MED ORDER — LACTATED RINGERS IV SOLN: INTRAVENOUS | Status: DC

## 2019-07-01 MED ORDER — HYDROCORTISONE 1 % EX CREA
TOPICAL_CREAM | Freq: Every day | CUTANEOUS | Status: DC | PRN
  Filled 2019-07-01: qty 28

## 2019-07-01 MED ORDER — MORPHINE SULFATE (PF) 2 MG/ML IV SOLN: 2.0000 mg | INTRAVENOUS | Status: DC | PRN

## 2019-07-01 MED ORDER — METHOCARBAMOL 500 MG PO TABS
500.0000 mg | ORAL_TABLET | Freq: Four times a day (QID) | ORAL | Status: DC | PRN
  Administered 2019-07-01: 500 mg via ORAL

## 2019-07-01 MED ORDER — ACETAMINOPHEN 325 MG PO TABS: 650.0000 mg | ORAL_TABLET | ORAL | Status: DC | PRN

## 2019-07-01 MED ORDER — POLYETHYLENE GLYCOL 3350 17 G PO PACK: 17.0000 g | PACK | Freq: Every day | ORAL | Status: DC | PRN

## 2019-07-01 MED ORDER — SODIUM CHLORIDE 0.9% FLUSH
3.0000 mL | Freq: Two times a day (BID) | INTRAVENOUS | Status: DC
  Administered 2019-07-01 – 2019-07-02 (×3): 3 mL via INTRAVENOUS

## 2019-07-01 MED ORDER — DOCUSATE SODIUM 100 MG PO CAPS
100.0000 mg | ORAL_CAPSULE | Freq: Two times a day (BID) | ORAL | Status: DC
  Administered 2019-07-01 – 2019-07-03 (×5): 100 mg via ORAL
  Filled 2019-07-01 (×5): qty 1

## 2019-07-01 MED ORDER — DEXAMETHASONE SODIUM PHOSPHATE 10 MG/ML IJ SOLN
INTRAMUSCULAR | Status: AC
  Filled 2019-07-01: qty 1

## 2019-07-01 MED ORDER — MIDAZOLAM HCL 2 MG/2ML IJ SOLN
1.0000 mg | Freq: Once | INTRAMUSCULAR | Status: AC
  Administered 2019-07-01: 1 mg via INTRAVENOUS

## 2019-07-01 MED ORDER — LIDOCAINE-EPINEPHRINE 1 %-1:100000 IJ SOLN
INTRAMUSCULAR | Status: DC | PRN
  Administered 2019-07-01: 5 mL

## 2019-07-01 MED ORDER — THROMBIN 20000 UNITS EX SOLR
CUTANEOUS | Status: DC | PRN
  Administered 2019-07-01: 20 mL via TOPICAL

## 2019-07-01 MED ORDER — BISACODYL 10 MG RE SUPP: 10.0000 mg | Freq: Every day | RECTAL | Status: DC | PRN

## 2019-07-01 MED ORDER — MORPHINE SULFATE (PF) 2 MG/ML IV SOLN
2.0000 mg | INTRAVENOUS | Status: DC | PRN
  Administered 2019-07-02 (×2): 2 mg via INTRAVENOUS
  Filled 2019-07-01 (×2): qty 1

## 2019-07-01 MED ORDER — OXYCODONE HCL 5 MG PO TABS: 5.0000 mg | ORAL_TABLET | Freq: Once | ORAL | Status: DC | PRN

## 2019-07-01 MED ORDER — SUGAMMADEX SODIUM 200 MG/2ML IV SOLN
INTRAVENOUS | Status: DC | PRN
  Administered 2019-07-01: 400 mg via INTRAVENOUS

## 2019-07-01 MED ORDER — OXYCODONE HCL 5 MG PO TABS
ORAL_TABLET | ORAL | Status: AC
  Filled 2019-07-01: qty 1

## 2019-07-01 MED ORDER — BUPIVACAINE HCL (PF) 0.5 % IJ SOLN
INTRAMUSCULAR | Status: AC
  Filled 2019-07-01: qty 30

## 2019-07-01 MED ORDER — ORAL CARE MOUTH RINSE: 15.0000 mL | Freq: Once | OROMUCOSAL | Status: AC

## 2019-07-01 MED ORDER — 0.9 % SODIUM CHLORIDE (POUR BTL) OPTIME
TOPICAL | Status: DC | PRN
  Administered 2019-07-01: 1000 mL

## 2019-07-01 MED ORDER — LIDOCAINE 2% (20 MG/ML) 5 ML SYRINGE
INTRAMUSCULAR | Status: DC | PRN
  Administered 2019-07-01: 40 mg via INTRAVENOUS

## 2019-07-01 MED ORDER — THROMBIN 5000 UNITS EX SOLR
CUTANEOUS | Status: AC
  Filled 2019-07-01: qty 5000

## 2019-07-01 MED ORDER — SODIUM CHLORIDE 0.9% FLUSH: 3.0000 mL | INTRAVENOUS | Status: DC | PRN

## 2019-07-01 MED ORDER — METHOCARBAMOL 1000 MG/10ML IJ SOLN
500.0000 mg | Freq: Four times a day (QID) | INTRAVENOUS | Status: DC | PRN
  Filled 2019-07-01: qty 5

## 2019-07-01 MED ORDER — LIDOCAINE 2% (20 MG/ML) 5 ML SYRINGE
INTRAMUSCULAR | Status: AC
  Filled 2019-07-01: qty 5

## 2019-07-01 MED ORDER — METHOCARBAMOL 500 MG PO TABS
ORAL_TABLET | ORAL | Status: AC
  Filled 2019-07-01: qty 1

## 2019-07-01 MED ORDER — PROPOFOL 10 MG/ML IV BOLUS
INTRAVENOUS | Status: DC | PRN
  Administered 2019-07-01: 10 mg via INTRAVENOUS
  Administered 2019-07-01: 150 mg via INTRAVENOUS

## 2019-07-01 MED ORDER — PHENYLEPHRINE 40 MCG/ML (10ML) SYRINGE FOR IV PUSH (FOR BLOOD PRESSURE SUPPORT)
PREFILLED_SYRINGE | INTRAVENOUS | Status: DC | PRN
  Administered 2019-07-01 (×4): 40 ug via INTRAVENOUS

## 2019-07-01 MED ORDER — ACETAMINOPHEN 650 MG RE SUPP: 650.0000 mg | RECTAL | Status: DC | PRN

## 2019-07-01 MED ORDER — PHENOL 1.4 % MT LIQD: 1.0000 | OROMUCOSAL | Status: DC | PRN

## 2019-07-01 MED ORDER — FENTANYL CITRATE (PF) 100 MCG/2ML IJ SOLN
25.0000 ug | INTRAMUSCULAR | Status: DC | PRN
  Administered 2019-07-01: 25 ug via INTRAVENOUS
  Administered 2019-07-01 (×2): 50 ug via INTRAVENOUS
  Administered 2019-07-01: 75 ug via INTRAVENOUS

## 2019-07-01 MED ORDER — KETOROLAC TROMETHAMINE 15 MG/ML IJ SOLN
15.0000 mg | Freq: Four times a day (QID) | INTRAMUSCULAR | Status: DC
  Administered 2019-07-01: 15 mg via INTRAVENOUS

## 2019-07-01 MED ORDER — PROPOFOL 10 MG/ML IV BOLUS
INTRAVENOUS | Status: AC
  Filled 2019-07-01: qty 20

## 2019-07-01 MED ORDER — ALBUMIN HUMAN 5 % IV SOLN: INTRAVENOUS | Status: DC | PRN

## 2019-07-01 SURGICAL SUPPLY — 65 items
BAG DECANTER FOR FLEXI CONT (MISCELLANEOUS) ×3 IMPLANT
BASKET BONE COLLECTION (BASKET) ×3 IMPLANT
BLADE CLIPPER SURG (BLADE) IMPLANT
BONE CANC CHIPS 40CC CAN1/2 (Bone Implant) ×3 IMPLANT
BUR MATCHSTICK NEURO 3.0 LAGG (BURR) ×3 IMPLANT
CAGE COROENT LG 10X9X23-12 (Cage) ×6 IMPLANT
CANISTER SUCT 3000ML PPV (MISCELLANEOUS) ×3 IMPLANT
CHIPS CANC BONE 40CC CAN1/2 (Bone Implant) ×1 IMPLANT
CNTNR URN SCR LID CUP LEK RST (MISCELLANEOUS) ×1 IMPLANT
CONT SPEC 4OZ STRL OR WHT (MISCELLANEOUS) ×2
COVER BACK TABLE 60X90IN (DRAPES) ×3 IMPLANT
COVER WAND RF STERILE (DRAPES) IMPLANT
DECANTER SPIKE VIAL GLASS SM (MISCELLANEOUS) ×3 IMPLANT
DERMABOND ADVANCED (GAUZE/BANDAGES/DRESSINGS) ×2
DERMABOND ADVANCED .7 DNX12 (GAUZE/BANDAGES/DRESSINGS) ×1 IMPLANT
DEVICE DISSECT PLASMABLAD 3.0S (MISCELLANEOUS) ×1 IMPLANT
DRAPE C-ARM 42X72 X-RAY (DRAPES) ×6 IMPLANT
DRAPE HALF SHEET 40X57 (DRAPES) IMPLANT
DRAPE LAPAROTOMY 100X72X124 (DRAPES) ×3 IMPLANT
DRSG OPSITE POSTOP 4X8 (GAUZE/BANDAGES/DRESSINGS) ×3 IMPLANT
DURAPREP 26ML APPLICATOR (WOUND CARE) ×3 IMPLANT
DURASEAL APPLICATOR TIP (TIP) IMPLANT
DURASEAL SPINE SEALANT 3ML (MISCELLANEOUS) IMPLANT
ELECT REM PT RETURN 9FT ADLT (ELECTROSURGICAL) ×3
ELECTRODE REM PT RTRN 9FT ADLT (ELECTROSURGICAL) ×1 IMPLANT
GAUZE 4X4 16PLY RFD (DISPOSABLE) IMPLANT
GAUZE SPONGE 4X4 12PLY STRL (GAUZE/BANDAGES/DRESSINGS) IMPLANT
GLOVE BIOGEL PI IND STRL 6.5 (GLOVE) ×2 IMPLANT
GLOVE BIOGEL PI IND STRL 8.5 (GLOVE) ×2 IMPLANT
GLOVE BIOGEL PI INDICATOR 6.5 (GLOVE) ×4
GLOVE BIOGEL PI INDICATOR 8.5 (GLOVE) ×4
GLOVE ECLIPSE 6.5 STRL STRAW (GLOVE) ×6 IMPLANT
GLOVE ECLIPSE 8.5 STRL (GLOVE) ×6 IMPLANT
GLOVE SURG SS PI 6.0 STRL IVOR (GLOVE) ×15 IMPLANT
GOWN STRL REUS W/ TWL LRG LVL3 (GOWN DISPOSABLE) ×5 IMPLANT
GOWN STRL REUS W/ TWL XL LVL3 (GOWN DISPOSABLE) IMPLANT
GOWN STRL REUS W/TWL 2XL LVL3 (GOWN DISPOSABLE) ×6 IMPLANT
GOWN STRL REUS W/TWL LRG LVL3 (GOWN DISPOSABLE) ×10
GOWN STRL REUS W/TWL XL LVL3 (GOWN DISPOSABLE)
HEMOSTAT POWDER KIT SURGIFOAM (HEMOSTASIS) ×3 IMPLANT
KIT BASIN OR (CUSTOM PROCEDURE TRAY) ×3 IMPLANT
KIT TURNOVER KIT B (KITS) ×3 IMPLANT
MILL MEDIUM DISP (BLADE) ×3 IMPLANT
NEEDLE HYPO 22GX1.5 SAFETY (NEEDLE) ×3 IMPLANT
NS IRRIG 1000ML POUR BTL (IV SOLUTION) ×3 IMPLANT
PACK LAMINECTOMY NEURO (CUSTOM PROCEDURE TRAY) ×3 IMPLANT
PAD ARMBOARD 7.5X6 YLW CONV (MISCELLANEOUS) ×21 IMPLANT
PATTIES SURGICAL .5 X1 (DISPOSABLE) ×3 IMPLANT
PLASMABLADE 3.0S (MISCELLANEOUS) ×3
ROD RELINE-O LORD 5.5X35MM (Rod) ×6 IMPLANT
SCREW LOCK RELINE 5.5 TULIP (Screw) ×12 IMPLANT
SCREW RELINE-O POLY 6.5X45 (Screw) ×12 IMPLANT
SPONGE LAP 4X18 RFD (DISPOSABLE) IMPLANT
SPONGE SURGIFOAM ABS GEL 100 (HEMOSTASIS) ×3 IMPLANT
SUT PROLENE 6 0 BV (SUTURE) IMPLANT
SUT VIC AB 1 CT1 18XBRD ANBCTR (SUTURE) ×1 IMPLANT
SUT VIC AB 1 CT1 8-18 (SUTURE) ×2
SUT VIC AB 2-0 CP2 18 (SUTURE) ×3 IMPLANT
SUT VIC AB 3-0 SH 8-18 (SUTURE) ×3 IMPLANT
SUT VIC AB 4-0 RB1 18 (SUTURE) ×3 IMPLANT
SYR 3ML LL SCALE MARK (SYRINGE) ×12 IMPLANT
TOWEL GREEN STERILE (TOWEL DISPOSABLE) ×3 IMPLANT
TOWEL GREEN STERILE FF (TOWEL DISPOSABLE) ×3 IMPLANT
TRAY FOLEY MTR SLVR 16FR STAT (SET/KITS/TRAYS/PACK) ×3 IMPLANT
WATER STERILE IRR 1000ML POUR (IV SOLUTION) ×3 IMPLANT

## 2019-07-01 NOTE — Progress Notes (Signed)
PT Cancellation Note  Patient Details Name: [name] MRN: [id number] DOB: 1990/08/28   Cancelled Treatment:    Reason Eval/Treat Not Completed: Other (comment).  Pt is nauseated, nearly vomiting and declined PT x 2.  Retry at another time.   Ivar Drape 07/01/2019, 3:51 PM   Samul Dada, PT MS Acute Rehab Dept. Number: [facility] [id number] and [facility] [phone]

## 2019-07-01 NOTE — Anesthesia Preprocedure Evaluation (Signed)
Anesthesia Evaluation  Patient identified by MRN, date of birth, ID band Patient awake    Reviewed: Allergy & Precautions, NPO status , Patient's Chart, lab work & pertinent test results  Airway Mallampati: II  TM Distance: >3 FB Neck ROM: Full    Dental  (+) Teeth Intact, Dental Advisory Given   Pulmonary    breath sounds clear to auscultation       Cardiovascular  Rhythm:Regular Rate:Normal     Neuro/Psych    GI/Hepatic   Endo/Other    Renal/GU      Musculoskeletal   Abdominal (+) + obese,   Peds  Hematology   Anesthesia Other Findings   Reproductive/Obstetrics                             Anesthesia Physical Anesthesia Plan  ASA: III  Anesthesia Plan: General   Post-op Pain Management:    Induction: Intravenous  PONV Risk Score and Plan: Ondansetron and Dexamethasone  Airway Management Planned: Oral ETT  Additional Equipment:   Intra-op Plan:   Post-operative Plan: Extubation in OR  Informed Consent: I have reviewed the patients History and Physical, chart, labs and discussed the procedure including the risks, benefits and alternatives for the proposed anesthesia with the patient or authorized representative who has indicated his/her understanding and acceptance.     Dental advisory given  Plan Discussed with: CRNA and Anesthesiologist  Anesthesia Plan Comments:         Anesthesia Quick Evaluation  

## 2019-07-01 NOTE — Op Note (Signed)
Date of surgery: 07/01/2019 Preoperative diagnosis: Spondylolisthesis L5-S1 with lumbar radiculopathy. Postoperative diagnosis: Same Procedure: Laminectomy L5 decompression the L5 and S1 nerve roots posterior lumbar interbody arthrodesis with peek spacers local autograft and allograft.  Posterior lateral arthrodesis with local autograft and allograft.  Pedicle screw fixation L5-S1. Surgeon: Kristeen Miss First Assistant: Ashok Pall MD Anesthesia: General endotracheal Indications: Morgan Olson is a 29 year old female whose had extensive treatment of her spondylolisthesis via conservative means.  Over the years however she has developed increasingly severe back pain with 8 out of 10 pain on a regular basis.  She notes that activities of daily living have become very difficult if not impossible.  Her recent MRI demonstrated she had severe stenosis at L5-S1 with 12 mm mobile spondylolisthesis at the L5-S1 level.  She is advised regarding the need for surgical decompression and stabilization.  Procedure: Patient was brought to the operating room supine on the stretcher.  After the smooth induction of general endotracheal anesthesia and placement of a Foley catheter, she was carefully turned prone.  The bony prominences were appropriately padded and protected.  The back was prepped with alcohol DuraPrep and draped in a sterile fashion.  Vertical incision was made in the lower part of the lumbar spine and this was carried down to the lumbodorsal fascia.  For spinous process was noted to be that of L5 was noted to be somewhat mobile.  Paraspinous fascia was opened on either side of midline to expose the spinous process of L5 and a subperiosteal interlaminar dissection was then performed.  Once the laminar arch of L5 was completely exposed was gradually mobilized the pars defects were quite obvious.  The facets which were hypertrophied on the inferior articular process is noted that the superior articular processes  of S1 were poorly developed.  Nonetheless there were gradually mobilized and the entire laminar arch of L5 was removed.  Then the dissection was carried out cephalad and there was noted to be a sesamoid bone under L4 that was contributing to some substantial central compression.  This was removed on the left side and a separate sesamoid bone on the right side was also encountered once these were both removed the common dural tube could well be exposed and the path of the L5 nerve roots was carefully decompressed in its travel towards the foramen.  The disc space was ultimately isolated with a bipolar cautery and microscissors epidural veins were divided in this region and once the dura could be mobilized from by dissecting the path of the S1 nerve roots the dura was retracted medially and at this space was entered with a #15 blade.  Combination of curettes and rongeurs and disc shavers were then used to remove a substantial quantity of severely degenerated desiccated disc material.  As the depth of the discectomy was reached the ligament around the ventral aspect of the disc space was noted to be fairly healthy once the endplates were curettaged to remove all bony overgrowth material a spacer measuring 10 mm in height was placed into the interspace to maintain distraction while the disc space was emptied further.  Ultimately once both ends were decorticated the interspace was sized for an appropriately sized spacer and it was felt that a 10 mm tall by 9 x 23 mm spacer with 12 degrees of lordosis would fit best into this interval to maintain the traversing L5 nerve root foramen open.  These were then packed with autograft and allograft combination from the laminar bone and  40cc quantity of bone chips that were open.  A total of 12 cc of this bony mixture was placed into the interspace along with the 2 cages which were previously packed.  Attention was turned to the lateral gutters which were shaved down to the  intertransverse process of L5 and the sacral ala which were decorticated.  6 cc of bone were packed into each lateral gutter and then pedicle entry sites were chosen at L5 and S1 6.5 x 45 mm pedicle screws were placed both in L4-5 and S1 S1 was noted to have bicortical purchase.  35 mm precontoured rods were then used to connect the screws together in a neutral construct and once this was tightened down an additional 6 cc of bone could be packed into the lateral gutter on either side hemostasis was then carefully checked and the pads of the L5 and S1 nerve roots were checked to make sure that there were adequately decompressed and the and intact once this was verified the retractors were removed lumbodorsal fascia was closed with #1 Vicryl interrupted fashion 2-0 Vicryl was used in the subcutaneous tissues and 3-0 Vicryl subcuticularly.  Final skin closure was performed with 4-0 Vicryl in subcuticular layer.  Dermabond was placed on the skin and blood loss for the procedure was estimated at less than 200 cc.

## 2019-07-01 NOTE — Progress Notes (Signed)
Orthopedic Tech Progress Note Patient Details:  Jaliza Seifried 07-17-1990 [id number] Called in order to [facility] for an ASPEN LUMBAR BRACE Patient ID: Kambrey Hagger, female   DOB: Jul 26, 1990, 29 y.o.   MRN: [id number]   Donald Pore 07/01/2019, 2:01 PM

## 2019-07-01 NOTE — H&P (Signed)
CHIEF COMPLAINT: Worsening back pain with occasional radiculopathy.  HISTORY OF PRESENT ILLNESS: Morgan Olson is a 29 year old individual, who has been a long-time patient of Morgan Olson.  He has been following her for a spondylolisthesis over the last 5 years, at least.  In 2016, she had an MRI that demonstrated a grade 1 spondylolisthesis and at that time, plain x-rays demonstrated that she had a 9 mm anterolisthesis at L5 on S1 that was minimally mobile between flexion and extension.  Today, she has had repeat films.  She notes that the pain has become intolerable and intractable.  The pain is centralized in her low back.  She gets occasional radiation in the buttocks and down the lower extremities and a flashing sensation, but she notes that on a chronic basis, the pain is at about a level of an 8 and is unrelenting.  The x-rays today demonstrate that her spondylolisthesis has increased to 15 mm and again remains minimally mobile when she is standing.  An MRI done in December demonstrated that her spondylolisthesis appeared to be at 9 mm, somewhat reduced when lying in the supine position.  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  Morgan Olson had been encouraging conservative management for Morgan Olson all this time, but Morgan Olson notes that she is reaching the end of her rope in terms of dealing with the pain.  She has a 73-year-old at home and she finds it difficult to maintain parenting activities for her young child.  She would also like to consider having further children, but at this point, because of her back, she is not certain that she would be able to carry a pregnancy beyond that.  She notes that she has struggled with her weight and had gastric sleeve surgery a little over a year ago and has lost upwards of 80 pounds.  Despite this weight loss, she is still having continued and worsening pain.  Today in  the office, we note her BMI is at 41.  PAST MEDICAL HISTORY: Reveals that her health in general has been very good.  She likes to maintain a good level of activity and enjoys walking, but because of the increased pain, she finds her activity is deteriorating.  Furthermore, she finds that she is becoming increasingly depressed because of her current disabilities, and she had in the past discussed consideration of surgery, but Morgan Olson had strongly urged against it, noting her young age and her weight situation.  She is seeking some definitive intervention at this point as she is doing all she can to maintain herself.  Her past medical history is as noted.  PHYSICAL EXAM: Today reveals that her motor strength in the lower extremities is good in the tibialis anterior and gastrocs as noted by her capacity for toe and heel walking.  She stands straight and erect and mobilizes without too much difficulty.  Tone and bulk in the major groups are intact.  IMPRESSION: Given the long course of conservative management that Morgan Olson has had and the progressive spondylolisthesis that has evolved over the last 5 years, I believe that ultimately Morgan Olson needs to consider surgical decompression and stabilization at L5-S1.  The December MRI demonstrates that her other discs including L4-L5 are quite healthy and normal in appearance.  I demonstrated to her on a model what surgical intervention involves; removal of the laminar arch of L5 with the  spondylolisthetic segment and decompressing the L5 nerve roots, then performing a total discectomy, placing spacers into the interspace at L5-S1, and then placing 4 screws, 2 in L5 and 2 in S1 and connecting them with short rods to hold the fixation in position.  This is a substantial operation but most younger individuals can tolerate it with a simple overnight in the hospital.  It does take time, however, to heal this, as I noted that we are removing the disc and essentially creating  a fracture between the 2 vertebrae so that the bone grows from one vertebrae to the next.  This should hopefully give good alleviation of the chronic back pain and over time allow this process to heal so that she can resume most normal activities.  Indeed, some patients will feel some stiffness with their back because of the fused segment, but over time, their level of function can return to near normal.  I believe that she would be able to consider bearing further children once this process heals, but I would suggest holding off for about 6 months to a year after the surgery to make sure that she is well healed when she does so.  At this point, I do not believe that further conservative efforts are likely to yield much improvement.  Morgan Olson has made good strides in weight loss and she would like to continue this process, but she notes that with her activity limitation at this point, it is hard to get any exercise that she can tolerate.  It also appears to be affecting her overall outlook, as she has discussed depression due to the inactivity and due to inability to do the activities of daily living that is required in motherhood.  We will plan on scheduling the surgery at the earliest convenience for her.

## 2019-07-01 NOTE — Transfer of Care (Signed)
Immediate Anesthesia Transfer of Care Note  Patient: Morgan Olson  Procedure(s) Performed: Lumbar Five Sacral One Posterior lumbar interbody fusion (N/A Spine Lumbar)  Patient Location: PACU  Anesthesia Type:General  Level of Consciousness: awake, alert , oriented, patient cooperative and responds to stimulation  Airway & Oxygen Therapy: Patient Spontanous Breathing and Patient connected to face mask oxygen  Post-op Assessment: Report given to RN, Post -op Vital signs reviewed and stable and Patient moving all extremities X 4  Post vital signs: Reviewed and stable  Last Vitals:  Vitals Value Taken Time  BP 108/84 07/01/19 1209  Temp 37 C 07/01/19 1205  Pulse 104 07/01/19 1211  Resp 18 07/01/19 1211  SpO2 100 % 07/01/19 1211  Vitals shown include unvalidated device data.  Last Pain:  Vitals:   07/01/19 0609  TempSrc:   PainSc: 7       Patients Stated Pain Goal: 4 (07/01/19 6314)  Complications: No apparent anesthesia complications

## 2019-07-01 NOTE — Anesthesia Postprocedure Evaluation (Signed)
Anesthesia Post Note  Patient: Morgan Olson  Procedure(s) Performed: Lumbar Five Sacral One Posterior lumbar interbody fusion (N/A Spine Lumbar)     Patient location during evaluation: PACU Anesthesia Type: General Level of consciousness: awake and alert Pain management: pain level controlled Vital Signs Assessment: post-procedure vital signs reviewed and stable Respiratory status: spontaneous breathing, nonlabored ventilation, respiratory function stable and patient connected to nasal cannula oxygen Cardiovascular status: blood pressure returned to baseline and stable Postop Assessment: no apparent nausea or vomiting Anesthetic complications: no    Last Vitals:  Vitals:   07/01/19 1253 07/01/19 1315  BP:  121/73  Pulse: 100 (!) 109  Resp: 11 18  Temp: 36.4 C 36.8 C  SpO2: 92% 97%    Last Pain:  Vitals:   07/01/19 1520  TempSrc:   PainSc: 7                  Jahzier Villalon COKER

## 2019-07-02 MED ORDER — DEXAMETHASONE SODIUM PHOSPHATE 4 MG/ML IJ SOLN
4.0000 mg | Freq: Once | INTRAMUSCULAR | Status: AC
  Administered 2019-07-02: 4 mg via INTRAVENOUS
  Filled 2019-07-02: qty 1

## 2019-07-02 MED ORDER — DIAZEPAM 5 MG PO TABS
5.0000 mg | ORAL_TABLET | Freq: Four times a day (QID) | ORAL | Status: DC | PRN
  Administered 2019-07-02 – 2019-07-03 (×3): 5 mg via ORAL
  Filled 2019-07-02 (×3): qty 1

## 2019-07-02 MED ORDER — CYCLOBENZAPRINE HCL 10 MG PO TABS
10.0000 mg | ORAL_TABLET | Freq: Three times a day (TID) | ORAL | Status: DC | PRN
  Administered 2019-07-02: 10 mg via ORAL

## 2019-07-02 MED ORDER — KETOROLAC TROMETHAMINE 15 MG/ML IJ SOLN
15.0000 mg | Freq: Four times a day (QID) | INTRAMUSCULAR | Status: DC
  Administered 2019-07-02 – 2019-07-03 (×4): 15 mg via INTRAVENOUS
  Filled 2019-07-02 (×4): qty 1

## 2019-07-02 MED ORDER — OXYCODONE HCL 5 MG PO TABS
5.0000 mg | ORAL_TABLET | ORAL | Status: DC | PRN
  Administered 2019-07-02 – 2019-07-03 (×4): 10 mg via ORAL
  Filled 2019-07-02 (×4): qty 2

## 2019-07-02 NOTE — Progress Notes (Signed)
Patient ID: Morgan Olson, female   DOB: 01-19-1991, 29 y.o.   MRN: [id number] Vital signs are stable Patient having severe spasms in her back that prevent her from moving Pain does not radiate into the lower extremities She notes that say central in the low back and is sharp and acute and truly takes her breath away Pain medicine does not seem to help that much Likely related to some inflammatory changes related to the surgery and also some spasm We will change over to Valium for muscle relaxer medication.  We will also add Toradol and low-dose Decadron.

## 2019-07-02 NOTE — Evaluation (Signed)
Occupational Therapy Evaluation Patient Details Name: Morgan Olson MRN: [id number] DOB: 15-Nov-1990 Today's Date: 07/02/2019    History of Present Illness 29 yo female s/p L5-s1 laminectomy PMH gastric sleeve surgery   Clinical Impression   Patient is s/p back surgery resulting in functional limitations due to the deficits listed below (see OT problem list). Pt currently limited by pain and unable to cross into figure 4 seated. Pt will require AE for LB dressing or family (A) to maintain back precautions.  Patient will benefit from skilled OT acutely to increase independence and safety with ADLS to allow discharge home.     Follow Up Recommendations  No OT follow up    Equipment Recommendations  None recommended by OT    Recommendations for Other Services       Precautions / Restrictions Precautions Precautions: Back Precaution Comments: back handout provided and reviewed for adls.  Required Braces or Orthoses: Spinal Brace Spinal Brace: Lumbar corset;Applied in sitting position Restrictions Weight Bearing Restrictions: No      Mobility Bed Mobility               General bed mobility comments: on eob on arrival  Transfers Overall transfer level: Needs assistance   Transfers: Sit to/from Stand Sit to Stand: Min guard         General transfer comment: pt requries increased time and deep breathing to attempt. pt reports "i have the worst pain with this" pt describes the pain not being affected by pain medication and jsut making her tired    Balance                                           ADL either performed or assessed with clinical judgement   ADL Overall ADL's : Needs assistance/impaired Eating/Feeding: Independent   Grooming: Oral care;Supervision/safety Grooming Details (indicate cue type and reason): demonstrates cup method for precautions Upper Body Bathing: Supervision/ safety   Lower Body Bathing: Minimal assistance Lower  Body Bathing Details (indicate cue type and reason): encouraged use of reacher. pt educated on purchase of reacher Upper Body Dressing : Supervision/safety   Lower Body Dressing: Minimal assistance   Toilet Transfer: Min guard           Functional mobility during ADLs: Min guard    Back handout provided and reviewed adls in detail. Pt educated on: clothing between brace, never sleep in brace, set an alarm at night for medication, avoid sitting for long periods of time, correct bed positioning for sleeping, correct sequence for bed mobility, avoiding lifting more than 5 pounds and never wash directly over incision.    Vision Baseline Vision/History: No visual deficits       Perception     Praxis      Pertinent Vitals/Pain Pain Assessment: Faces Pain Score: 10-Worst pain ever Pain Location: back  Pain Descriptors / Indicators: Grimacing;Discomfort;Constant;Spasm Pain Intervention(s): Monitored during session;Premedicated before session;Repositioned     Hand Dominance Right   Extremity/Trunk Assessment Upper Extremity Assessment Upper Extremity Assessment: Overall WFL for tasks assessed   Lower Extremity Assessment Lower Extremity Assessment: Defer to PT evaluation   Cervical / Trunk Assessment Cervical / Trunk Assessment: Other exceptions(s/p surg)   Communication Communication Communication: No difficulties   Cognition Arousal/Alertness: Awake/alert Behavior During Therapy: WFL for tasks assessed/performed Overall Cognitive Status: Within Functional Limits for tasks assessed  General Comments  incision dry and intact. pt provided sex back handout as she is young so that she will have as a reference with understanding that not advised prior to 6 weeks post op. pt appreciates Ot giving information to her as this is not an area she thoguht to ask about. pt educated on "myfitnesspaL and other low cost or free options to  help with safe stretching routine. Pt educated on knee flexion for glut stretch supine now prior to bed mobility    Exercises     Shoulder Instructions      Home Living Family/patient expects to be discharged to:: Private residence Living Arrangements: Spouse/significant other;Children Available Help at Discharge: Family Type of Home: House Home Access: Level entry     Home Layout: One level     Bathroom Shower/Tub: Producer, television/film/video: Standard     Home Equipment: None   Additional Comments: mother will help her 24/7 upon d/c. 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        OT Problem List: Decreased strength;Decreased activity tolerance;Impaired balance (sitting and/or standing);Decreased knowledge of precautions;Obesity;Pain      OT Treatment/Interventions: Self-care/ADL training;Therapeutic exercise;Neuromuscular education;DME and/or AE instruction;Energy conservation;Therapeutic activities;Patient/family education;Balance training    OT Goals(Current goals can be found in the care plan section) Acute Rehab OT Goals Patient Stated Goal: to get pain under control prior to leaving OT Goal Formulation: With patient Time For Goal Achievement: 07/16/19 Potential to Achieve Goals: Good  OT Frequency: Min 2X/week   Barriers to D/C:            Co-evaluation              AM-PAC OT "6 Clicks" Daily Activity     Outcome Measure Help from another person eating meals?: None Help from another person taking care of personal grooming?: None Help from another person toileting, which includes using toliet, bedpan, or urinal?: A Little Help from another person bathing (including washing, rinsing, drying)?: A Little Help from another person to put on and taking off regular upper body clothing?: None Help from another person to put on  and taking off regular lower body clothing?: A Little 6 Click Score: 21   End of Session Equipment Utilized During Treatment: Back brace Nurse Communication: Mobility status;Precautions  Activity Tolerance: Patient tolerated treatment well Patient left: in chair;with call bell/phone within reach  OT Visit Diagnosis: Unsteadiness on feet (R26.81);Muscle weakness (generalized) (M62.81)                Time: 2130-8657 OT Time Calculation (min): 32 min Charges:  OT General Charges $OT Visit: 1 Visit OT Evaluation $OT Eval Moderate Complexity: 1 Mod OT Treatments $Self Care/Home Management : 8-22 mins   Brynn, OTR/L  Acute Rehabilitation Services Pager: [phone] Office: [phone] .   Mateo Flow 07/02/2019, 10:12 AM

## 2019-07-02 NOTE — Evaluation (Signed)
Physical Therapy Evaluation Patient Details Name: Morgan Olson MRN: [id number] DOB: [birth year] Today's Date: 07/02/2019   History of Present Illness  Pt is a 29 y/o female s/p L5-S1 laminectomy PMH gastric sleeve surgery  Clinical Impression  Pt presented standing in room and willing to participate in therapy session. Prior to admission, pt reported that she was independent with all functional mobility and ADLs. Pt lives in a one level home with a level entry. She will have family support from her mother upon d/c. At the time of evaluation, pt limited secondary to significant pain (pt in tears at end of session). She was able to perform transfers with supervision and ambulate in hallway without use of an AD with supervision. Pt with no LOB or instability throughout. PT provided pt education re: back precautions and a generalized walking program for pt to initiate upon d/c home. PT will continue to f/u with pt acutely to progress mobility as tolerated hopefully with pain better managed.     Follow Up Recommendations No PT follow up    Equipment Recommendations  3in1 (PT)    Recommendations for Other Services       Precautions / Restrictions Precautions Precautions: Back Precaution Comments: reviewed 3/3 back precautions with pt throughout Required Braces or Orthoses: Spinal Brace Spinal Brace: Lumbar corset;Applied in sitting position Restrictions Weight Bearing Restrictions: No      Mobility  Bed Mobility               General bed mobility comments: pt standing in room upon arrival and remained EOB at end of session  Transfers Overall transfer level: Needs assistance Equipment used: None Transfers: Sit to/from Stand Sit to Stand: Supervision         General transfer comment: increased time and effort, very painful  Ambulation/Gait Ambulation/Gait assistance: Supervision Gait Distance (Feet): 500 Feet Assistive device: None Gait Pattern/deviations: Step-through  pattern;Decreased stride length Gait velocity: decreased   General Gait Details: pt with slow, cautious and steady gait overall; no LOB or need for physical assistance  Stairs            Wheelchair Mobility    Modified Rankin (Stroke Patients Only)       Balance Overall balance assessment: Needs assistance Sitting-balance support: Feet supported Sitting balance-Leahy Scale: Good     Standing balance support: During functional activity;No upper extremity supported Standing balance-Leahy Scale: Fair                               Pertinent Vitals/Pain Pain Assessment: Faces Pain Score: 10-Worst pain ever Faces Pain Scale: Hurts worst Pain Location: back  Pain Descriptors / Indicators: Grimacing;Discomfort;Constant;Spasm Pain Intervention(s): Monitored during session;Repositioned;Patient requesting pain meds-RN notified    Home Living Family/patient expects to be discharged to:: Private residence Living Arrangements: Spouse/significant other;Children Available Help at Discharge: Family Type of Home: House Home Access: Level entry     Home Layout: One level Home Equipment: None Additional Comments: mother will help her 24/7 upon d/c. 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     Hand Dominance   Dominant Hand: Right    Extremity/Trunk Assessment   Upper Extremity Assessment Upper Extremity Assessment: Defer to OT evaluation;Overall [facility] for tasks assessed    Lower Extremity Assessment Lower Extremity Assessment:  Overall [facility] for tasks assessed    Cervical / Trunk Assessment Cervical / Trunk Assessment: Other exceptions Cervical / Trunk Exceptions: s/p back sx  Communication   Communication: No difficulties  Cognition Arousal/Alertness: Awake/alert Behavior During Therapy: WFL for tasks assessed/performed Overall Cognitive Status:  Within Functional Limits for tasks assessed                                        General Comments General comments (skin integrity, edema, etc.): incision dry and intact. pt provided sex back handout as she is young so that she will have as a reference with understanding that not advised prior to 6 weeks post op. pt appreciates Ot giving information to her as this is not an area she thoguht to ask about. pt educated on "myfitnesspaL and other low cost or free options to help with safe stretching routine. Pt educated on knee flexion for glut stretch supine now prior to bed mobility    Exercises     Assessment/Plan    PT Assessment Patient needs continued PT services  PT Problem List Decreased activity tolerance;Decreased mobility;Pain       PT Treatment Interventions DME instruction;Gait training;Stair training;Functional mobility training;Therapeutic activities;Therapeutic exercise;Balance training;Neuromuscular re-education;Patient/family education    PT Goals (Current goals can be found in the Care Plan section)  Acute Rehab PT Goals Patient Stated Goal: to get pain under control prior to leaving PT Goal Formulation: With patient Time For Goal Achievement: 07/16/19 Potential to Achieve Goals: Good    Frequency Min 5X/week   Barriers to discharge        Co-evaluation               AM-PAC PT "6 Clicks" Mobility  Outcome Measure Help needed turning from your back to your side while in a flat bed without using bedrails?: None Help needed moving from lying on your back to sitting on the side of a flat bed without using bedrails?: A Little Help needed moving to and from a bed to a chair (including a wheelchair)?: None Help needed standing up from a chair using your arms (e.g., wheelchair or bedside chair)?: None Help needed to walk in hospital room?: None Help needed climbing 3-5 steps with a railing? : A Little 6 Click Score: 22    End of Session  Equipment Utilized During Treatment: Back brace Activity Tolerance: Patient limited by pain Patient left: in bed;with call bell/phone within reach;Other (comment)(sitting EOB) Nurse Communication: Mobility status;Patient requests pain meds PT Visit Diagnosis: Other abnormalities of gait and mobility (R26.89);Pain Pain - part of body: (back)    Time: 1610-9604 PT Time Calculation (min) (ACUTE ONLY): 25 min   Charges:   PT Evaluation $PT Eval Low Complexity: 1 Low PT Treatments $Gait Training: 8-22 mins        Anastasio Champion, DPT  Acute Rehabilitation Services Pager [phone] Office [place] 07/02/2019, 10:25 AM

## 2019-07-03 MED ORDER — DEXAMETHASONE 1 MG PO TABS
ORAL_TABLET | ORAL | 0 refills | Status: DC
Start: 2019-07-03 — End: 2020-01-19

## 2019-07-03 MED ORDER — DIAZEPAM 5 MG PO TABS: 5.0000 mg | ORAL_TABLET | Freq: Four times a day (QID) | ORAL | 0 refills | Status: DC | PRN

## 2019-07-03 MED ORDER — OXYCODONE-ACETAMINOPHEN 5-325 MG PO TABS: 1.0000 | ORAL_TABLET | ORAL | 0 refills | Status: DC | PRN

## 2019-07-03 NOTE — Progress Notes (Signed)
Patient is discharged from room 3C09 at this time. Alert and in stable condition. IV site d/c'd and instructions read to patient and spouse with understanding verbalized and all questions answered. Left unit via wheelchair with all belongings at side. .  

## 2019-07-03 NOTE — Progress Notes (Signed)
Physical Therapy Treatment Patient Details Name: Morgan Olson MRN: [id number] DOB: 08-13-1990 Today's Date: 07/03/2019    History of Present Illness Pt is a 29 y/o female s/p L5-S1 laminectomy PMH gastric sleeve surgery    PT Comments    Pt making excellent progress overall and with pain better managed today. She tolerated stair training without difficulties. Plan is to d/c home today with family support.    Follow Up Recommendations  No PT follow up     Equipment Recommendations  3in1 (PT)    Recommendations for Other Services       Precautions / Restrictions Precautions Precautions: Back Precaution Comments: pt able to verbalize 3/3 precautions Required Braces or Orthoses: Spinal Brace Spinal Brace: Lumbar corset;Applied in sitting position Restrictions Weight Bearing Restrictions: No    Mobility  Bed Mobility Overal bed mobility: Needs Assistance Bed Mobility: Rolling;Sit to Sidelying Rolling: Supervision       Sit to sidelying: Supervision General bed mobility comments: great log roll technique  Transfers Overall transfer level: Needs assistance Equipment used: None Transfers: Sit to/from Stand Sit to Stand: Supervision         General transfer comment: supevision for safety  Ambulation/Gait Ambulation/Gait assistance: Supervision Gait Distance (Feet): 500 Feet Assistive device: None Gait Pattern/deviations: Step-through pattern;Decreased stride length Gait velocity: decreased   General Gait Details: pt with slow, steady gait without the need for physical assistance or an AD   Stairs Stairs: Yes Stairs assistance: Supervision Stair Management: One rail Left;Step to pattern;Forwards Number of Stairs: 10 General stair comments: no instability or LOB   Wheelchair Mobility    Modified Rankin (Stroke Patients Only)       Balance Overall balance assessment: No apparent balance deficits (not formally assessed)                                           Cognition Arousal/Alertness: Awake/alert Behavior During Therapy: WFL for tasks assessed/performed Overall Cognitive Status: Within Functional Limits for tasks assessed                                        Exercises      General Comments        Pertinent Vitals/Pain Pain Assessment: Faces Faces Pain Scale: Hurts a little bit Pain Location: back  Pain Descriptors / Indicators: Sore Pain Intervention(s): Monitored during session;Repositioned    Home Living                      Prior Function            PT Goals (current goals can now be found in the care plan section) Acute Rehab PT Goals Patient Stated Goal: to get pain under control prior to leaving PT Goal Formulation: With patient Time For Goal Achievement: 07/16/19 Potential to Achieve Goals: Good Progress towards PT goals: Progressing toward goals    Frequency    Min 5X/week      PT Plan Current plan remains appropriate    Co-evaluation              AM-PAC PT "6 Clicks" Mobility   Outcome Measure  Help needed turning from your back to your side while in a flat bed without using bedrails?: None Help needed moving from lying on  your back to sitting on the side of a flat bed without using bedrails?: None Help needed moving to and from a bed to a chair (including a wheelchair)?: None Help needed standing up from a chair using your arms (e.g., wheelchair or bedside chair)?: None Help needed to walk in hospital room?: None Help needed climbing 3-5 steps with a railing? : None 6 Click Score: 24    End of Session Equipment Utilized During Treatment: Back brace Activity Tolerance: Patient tolerated treatment well Patient left: in bed;with call bell/phone within reach Nurse Communication: Mobility status;Patient requests pain meds PT Visit Diagnosis: Other abnormalities of gait and mobility (R26.89);Pain Pain - part of body:  (back)     Time:  0539-7673 PT Time Calculation (min) (ACUTE ONLY): 10 min  Charges:  $Gait Training: 8-22 mins                     Anastasio Champion, DPT  Acute Rehabilitation Services Pager [phone] Office [place] 07/03/2019, 10:31 AM

## 2019-07-03 NOTE — Discharge Instructions (Signed)

## 2019-07-03 NOTE — Discharge Summary (Signed)
Physician Discharge Summary  Patient ID: Morgan Olson MRN: [id number] DOB/AGE: May 01, 1990 29 y.o.  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  She tolerated surgery well.  She had substantial back pain initially postoperatively but this is come under good control with appropriate medications.  Consults: None  Significant Diagnostic Studies: None  Treatments: surgery: Posterior decompression and fusion L5-S1.  Discharge Exam: Blood pressure 111/67, pulse 88, temperature 98.9 F (37.2 C), temperature source Oral, resp. rate 18, height 5\' 2"  (1.575 m), weight 102.1 kg, last menstrual period 06/08/2019, SpO2 100 %, unknown if currently breastfeeding. Incision is clean dry motor function is intact.  Disposition: Discharge disposition: 01-Home or Self Care       Discharge Instructions    Call MD for:  redness, tenderness, or signs of infection (pain, swelling, redness, odor or green/yellow discharge around incision site)   Complete by: As directed    Call MD for:  severe uncontrolled pain   Complete by: As directed    Call MD for:  temperature >100.4   Complete by: As directed    Diet - low sodium heart healthy   Complete by: As directed    Discharge wound care:   Complete by: As directed    Okay to shower. Do not apply salves or appointments to incision. No heavy lifting with the upper extremities greater than 15 pounds. May resume driving when not requiring pain medication and patient feels comfortable with doing so.   Incentive spirometry RT   Complete by: As directed    Increase  activity slowly   Complete by: As directed      Allergies as of 07/03/2019   No Known Allergies     Medication List    TAKE these medications   amoxicillin-clavulanate 875-125 MG tablet Commonly known as: AUGMENTIN Take 1 tablet by mouth 2 (two) times daily.   dexamethasone 1 MG tablet Commonly known as: DECADRON 2 tablets twice daily for 2 days, one tablet twice daily for 2 days, one tablet daily for 2 days.   diazepam 5 MG tablet Commonly known as: VALIUM Take 1 tablet (5 mg total) by mouth every 6 (six) hours as needed for muscle spasms.   diclofenac 75 MG EC tablet Commonly known as: VOLTAREN Take 75 mg by mouth 2 (two) times daily.   docusate sodium 100 MG capsule Commonly known as: COLACE Take 100 mg by mouth 2 (two) times daily as needed for mild constipation.   MUCUS RELIEF DM PO Take 1 tablet by mouth every 12 (twelve) hours as needed (mucus relief).   multivitamin with minerals Tabs tablet Take 1 tablet by mouth daily.   oxyCODONE-acetaminophen 5-325 MG tablet Commonly known as: Percocet Take 1-2 tablets by mouth every 4 (four) hours as needed for moderate pain or severe pain.   PREPARATION H EX Apply 1 application topically daily as needed (hemorrhoids).   sodium chloride 0.65 % Soln nasal spray Commonly known as: OCEAN Place 1 spray into both nostrils 2 (two) times daily as needed for congestion.   Sprintec 28 0.25-35 MG-MCG tablet Generic drug: norgestimate-ethinyl estradiol Take 1 tablet by mouth daily.            Discharge Care  Instructions  (From admission, onward)         Start     Ordered   07/03/19 0000  Discharge wound care:       Comments: Okay to shower. Do not apply salves or appointments to incision. No heavy lifting with the upper extremities greater than 15 pounds. May resume driving when not requiring pain medication and patient feels comfortable with doing so.   07/03/19 0907           Signed: Blanchie Dessert  Lavera Vandermeer 07/03/2019, 9:08 AM

## 2019-07-03 NOTE — Progress Notes (Signed)
Occupational Therapy Treatment Patient Details Name: Morgan Olson MRN: [id number] DOB: 02/13/1990 Today's Date: 07/03/2019    History of present illness Pt is a 29 y/o female s/p L5-S1 laminectomy PMH gastric sleeve surgery   OT comments  Pt received up walking in room with brace donned reporting improved pain in comparison to prior OT session. Pt with good carryover of back precautions and previous education related to ADL participation. Pt reports having already obtained elevated toilet seat and shower seat from family member. Full demo provided of all AE with pt stating she already purchased reacher and plans to have family assist with all other LB ADLs. Education provided on compensatory methods related to posterior pericare as well as AE. Pt verbalize understanding, and appreciative of education. Pt likely to DC home later today with no OT f/u; will continue to follow acutely for OT needs.    Follow Up Recommendations  No OT follow up    Equipment Recommendations  None recommended by OT    Recommendations for Other Services      Precautions / Restrictions Precautions Precautions: Back Precaution Comments: pt able to verbalize 3/3 precautions Required Braces or Orthoses: Spinal Brace Spinal Brace: Lumbar corset;Applied in sitting position Restrictions Weight Bearing Restrictions: No       Mobility Bed Mobility               General bed mobility comments: oob in recliner  Transfers Overall transfer level: Needs assistance Equipment used: None Transfers: Sit to/from Stand Sit to Stand: Supervision         General transfer comment: supevision for safety    Balance Overall balance assessment: No apparent balance deficits (not formally assessed)                                         ADL either performed or assessed with clinical judgement   ADL Overall ADL's : Modified independent     Grooming: Oral care;Supervision/safety Grooming  Details (indicate cue type and reason): pt able to verbalize two cup  method; education on compensatory method for shaving legs and washing face while maintaining back precautions       Lower Body Bathing Details (indicate cue type and reason): pt reports obtaining reacher; education on LH sponge for LB bathing and for use of pericare with wash cloth around Upper Body Dressing : Modified independent Upper Body Dressing Details (indicate cue type and reason): pt recieved with back brace donned; able to state wearing schedule   Lower Body Dressing Details (indicate cue type and reason): pt continues to be unable to figure four; pt reports mom likely to help at home Toilet Transfer: Supervision/safety;Ambulation Toilet Transfer Details (indicate cue type and reason): pt recieved up walking in room with no AD   Toileting - Clothing Manipulation Details (indicate cue type and reason): extensive education provided on AE for toileting hygiene; demo'ed compensatory methods and AE to utilize with pt verbalizing understanding   Tub/Shower Transfer Details (indicate cue type and reason): pt reports having already obtained shower seat Functional mobility during ADLs: Supervision/safety General ADL Comments: pt presents with decreased pain this session; able to state precautions and demo good carryover     Vision       Perception     Praxis      Cognition Arousal/Alertness: Awake/alert Behavior During Therapy: WFL for tasks assessed/performed Overall Cognitive Status: Within Functional Limits  for tasks assessed                                          Exercises     Shoulder Instructions       General Comments      Pertinent Vitals/ Pain       Pain Assessment: Faces Faces Pain Scale: No hurt  Home Living                                          Prior Functioning/Environment              Frequency  Min 2X/week        Progress Toward  Goals  OT Goals(current goals can now be found in the care plan section)     Acute Rehab OT Goals Patient Stated Goal: to get pain under control prior to leaving OT Goal Formulation: With patient Time For Goal Achievement: 07/16/19 Potential to Achieve Goals: Good  Plan Discharge plan remains appropriate    Co-evaluation                 AM-PAC OT "6 Clicks" Daily Activity     Outcome Measure   Help from another person eating meals?: None   Help from another person toileting, which includes using toliet, bedpan, or urinal?: A Little Help from another person bathing (including washing, rinsing, drying)?: A Little Help from another person to put on and taking off regular upper body clothing?: None Help from another person to put on and taking off regular lower body clothing?: A Little 6 Click Score: 17    End of Session Equipment Utilized During Treatment: Back brace  OT Visit Diagnosis: Unsteadiness on feet (R26.81);Muscle weakness (generalized) (M62.81)   Activity Tolerance Patient tolerated treatment well   Patient Left in chair;with call bell/phone within reach   Nurse Communication Mobility status        Time: 1324-4010 OT Time Calculation (min): 10 min  Charges: OT General Charges $OT Visit: 1 Visit OT Treatments $Self Care/Home Management : 8-22 mins  Lanier Clam., COTA/L Acute Rehabilitation Services [phone]    Ihor Gully 07/03/2019, 8:23 AM

## 2019-08-30 ENCOUNTER — Other Ambulatory Visit: Payer: Self-pay

## 2019-08-30 ENCOUNTER — Ambulatory Visit
Admission: RE | Admit: 2019-08-30 | Discharge: 2019-08-30 | Disposition: A | Discharge status: Home / Self Care | Payer: BC Managed Care – PPO | Source: Ambulatory Visit | Attending: Emergency Medicine | Admitting: Emergency Medicine

## 2019-08-30 VITALS — BP 113/79 | HR 100 | Temp 98.8°F | Resp 17 | Ht 62.0 in | Wt 224.9 lb

## 2019-08-30 DIAGNOSIS — Z20822 Contact with and (suspected) exposure to covid-19: Secondary | ICD-10-CM | POA: Diagnosis present

## 2019-08-30 DIAGNOSIS — J069 Acute upper respiratory infection, unspecified: Secondary | ICD-10-CM | POA: Diagnosis present

## 2019-08-30 DIAGNOSIS — R05 Cough: Secondary | ICD-10-CM | POA: Diagnosis present

## 2019-08-30 DIAGNOSIS — R059 Cough, unspecified: Secondary | ICD-10-CM

## 2019-08-30 LAB — POCT RAPID STREP A (OFFICE): Rapid Strep A Screen: NEGATIVE

## 2019-08-30 MED ORDER — MELOXICAM 15 MG PO TABS
15.0000 mg | ORAL_TABLET | Freq: Every day | ORAL | 0 refills | Status: DC
Start: 2019-08-30 — End: 2020-01-19

## 2019-08-30 MED ORDER — BENZONATATE 100 MG PO CAPS: 100.0000 mg | ORAL_CAPSULE | Freq: Three times a day (TID) | ORAL | 0 refills | Status: DC

## 2019-08-30 MED ORDER — MELOXICAM 15 MG PO TABS
15.0000 mg | ORAL_TABLET | Freq: Every day | ORAL | 0 refills | Status: DC
Start: 2019-08-30 — End: 2019-08-30

## 2019-08-30 MED ORDER — BENZONATATE 100 MG PO CAPS
100.0000 mg | ORAL_CAPSULE | Freq: Three times a day (TID) | ORAL | 0 refills | Status: DC
Start: 2019-08-30 — End: 2019-08-30

## 2019-08-30 NOTE — ED Triage Notes (Signed)
Sore throat and congestion x 3 days 

## 2019-08-30 NOTE — ED Provider Notes (Signed)
[facility]   [id number] 08/30/19 Arrival Time: 1402   CC: COVID symptoms  SUBJECTIVE: History from: patient.  Morgan Olson is a 29 y.o. female who presents with sinus congestion, runny nose, sore throat, drainage, and cough x 3 days.  Daughter with symptoms.  Has tried OTC medications with minimal relief.  Symptoms are made worse with swallowing, but tolerating liquids and secretiosn.  Denies previous symptoms in the past.   Denies fever, chills, fatigue, SOB, wheezing, chest pain, nausea, changes in bowel or bladder habits.     ROS: As per HPI.  All other pertinent ROS negative.     Past Medical History:  Diagnosis Date  . Anxiety   . Asthma    brochitic  . Depression    hx of no meds   . History of hiatal hernia 07/08/2019   Past Surgical History:  Procedure Laterality Date  . DIAGNOSTIC LAPAROSCOPY    . GANGLION CYST EXCISION    . LAPAROSCOPIC GASTRIC SLEEVE RESECTION N/A 07/08/2018   Procedure: LAPAROSCOPIC GASTRIC SLEEVE RESECTION WITH HIATAL HERNIA REPAIR, UPPER ENDO, ERAS PATHWAY;  Surgeon: Gaynelle Adu, MD;  Location: [facility];  Service: General;  Laterality: N/A;   No Known Allergies No current facility-administered medications on file prior to encounter.   Current Outpatient Medications on File Prior to Encounter  Medication Sig Dispense Refill  . amoxicillin-clavulanate (AUGMENTIN) 875-125 MG tablet Take 1 tablet by mouth 2 (two) times daily.    Marland Kitchen dexamethasone (DECADRON) 1 MG tablet 2 tablets twice daily for 2 days, one tablet twice daily for 2 days, one tablet daily for 2 days. 15 tablet 0  . Dextromethorphan-guaiFENesin (MUCUS RELIEF DM PO) Take 1 tablet by mouth every 12 (twelve) hours as needed (mucus relief).    . diazepam (VALIUM) 5 MG tablet Take 1 tablet (5 mg total) by mouth every 6 (six) hours as needed for muscle spasms. 40 tablet 0  . diclofenac (VOLTAREN) 75 MG EC tablet Take 75 mg by mouth 2 (two) times daily.    Marland Kitchen docusate sodium (COLACE)  100 MG capsule Take 100 mg by mouth 2 (two) times daily as needed for mild constipation.    . Lidocaine-Glycerin (PREPARATION H EX) Apply 1 application topically daily as needed (hemorrhoids).    . Multiple Vitamin (MULTIVITAMIN WITH MINERALS) TABS tablet Take 1 tablet by mouth daily.    Marland Kitchen oxyCODONE-acetaminophen (PERCOCET) 5-325 MG tablet Take 1-2 tablets by mouth every 4 (four) hours as needed for moderate pain or severe pain. 60 tablet 0  . sodium chloride (OCEAN) 0.65 % SOLN nasal spray Place 1 spray into both nostrils 2 (two) times daily as needed for congestion.    . SPRINTEC 28 0.25-35 MG-MCG tablet Take 1 tablet by mouth daily.     Social History   Socioeconomic History  . Marital status: Married    Spouse name: Not on file  . Number of children: Not on file  . Years of education: Not on file  . Highest education level: Not on file  Occupational History  . Not on file  Tobacco Use  . Smoking status: Never Smoker  . Smokeless tobacco: Never Used  Vaping Use  . Vaping Use: Never used  Substance and Sexual Activity  . Alcohol use: No    Comment: "very rarely"  . Drug use: No  . Sexual activity: Not Currently  Other Topics Concern  . Not on file  Social History Narrative  . Not on file   Social  Determinants of Health   Financial Resource Strain:   . Difficulty of Paying Living Expenses:   Food Insecurity:   . Worried About Programme researcher, broadcasting/film/video in the Last Year:   . Barista in the Last Year:   Transportation Needs:   . Freight forwarder (Medical):   Marland Kitchen Lack of Transportation (Non-Medical):   Physical Activity:   . Days of Exercise per Week:   . Minutes of Exercise per Session:   Stress:   . Feeling of Stress :   Social Connections:   . Frequency of Communication with Friends and Family:   . Frequency of Social Gatherings with Friends and Family:   . Attends Religious Services:   . Active Member of Clubs or Organizations:   . Attends Tax inspector Meetings:   Marland Kitchen Marital Status:   Intimate Partner Violence:   . Fear of Current or Ex-Partner:   . Emotionally Abused:   Marland Kitchen Physically Abused:   . Sexually Abused:    Family History  Problem Relation Age of Onset  . Diabetes Maternal Aunt   . Cancer Maternal Aunt   . Heart disease Maternal Grandmother   . Diabetes Maternal Grandmother   . Cancer Maternal Grandmother   . Hypertension Paternal Grandmother     OBJECTIVE:  Vitals:   08/30/19 1452 08/30/19 1520  BP: 113/79   Pulse: 100   Resp: 17   Temp: 98.8 F (37.1 C)   TempSrc: Tympanic   SpO2: 99%   Weight:  224 lb 13.9 oz (102 kg)  Height:  5\' 2"  (1.575 m)     General appearance: alert; appears fatigued, but nontoxic; speaking in full sentences and tolerating own secretions HEENT: NCAT; Ears: EACs clear, TMs pearly gray; Eyes: PERRL.  EOM grossly intact. Nose: nares patent without rhinorrhea, Throat: oropharynx clear, but erythematous, tonsils non erythematous or enlarged, uvula midline  Neck: supple without LAD Lungs: unlabored respirations, symmetrical air entry; cough: absent; no respiratory distress; CTAB Heart: regular rate and rhythm.   Skin: warm and dry Psychological: alert and cooperative; normal mood and affect  LABS:  Results for orders placed or performed during the hospital encounter of 08/30/19 (from the past 24 hour(s))  POCT rapid strep A     Status: None   Collection Time: 08/30/19  2:59 PM  Result Value Ref Range   Rapid Strep A Screen Negative Negative     ASSESSMENT & PLAN:  1. Cough   2. Viral URI with cough   3. Suspected COVID-19 virus infection     Meds ordered this encounter  Medications  . DISCONTD: benzonatate (TESSALON) 100 MG capsule    Sig: Take 1 capsule (100 mg total) by mouth every 8 (eight) hours.    Dispense:  21 capsule    Refill:  0    Order Specific Question:   Supervising Provider    Answer:   10/30/19 Eustace Moore  . DISCONTD: meloxicam (MOBIC)  15 MG tablet    Sig: Take 1 tablet (15 mg total) by mouth daily.    Dispense:  30 tablet    Refill:  0    Order Specific Question:   Supervising Provider    Answer:   [[id number]] Eustace Moore  . benzonatate (TESSALON) 100 MG capsule    Sig: Take 1 capsule (100 mg total) by mouth every 8 (eight) hours.    Dispense:  21 capsule    Refill:  0  Order Specific Question:   Supervising Provider    Answer:   Eustace Moore [[id number]]  . meloxicam (MOBIC) 15 MG tablet    Sig: Take 1 tablet (15 mg total) by mouth daily.    Dispense:  30 tablet    Refill:  0    Order Specific Question:   Supervising Provider    Answer:   Eustace Moore [[id number]]   Strep negative.  Culture sent.  WE will follow up with you regarding abnormal results.   COVID testing ordered.  It will take between 5-7 days for test results.  Someone will contact you regarding abnormal results.    In the meantime: You should remain isolated in your home for 10 days from symptom onset AND greater than 72 hours after symptoms resolution (absence of fever without the use of fever-reducing medication and improvement in respiratory symptoms), whichever is longer Get plenty of rest and push fluids Tessalon Perles prescribed for cough Use OTC zyrtec for nasal congestion, runny nose, and/or sore throat Use OTC flonase for nasal congestion and runny nose Use medications daily for symptom relief Use OTC medications like ibuprofen or tylenol as needed fever or pain Call or go to the ED if you have any new or worsening symptoms such as fever, worsening cough, shortness of breath, chest tightness, chest pain, turning blue, changes in mental status, etc...   Mobic for sore throat  Reviewed expectations re: course of current medical issues. Questions answered. Outlined signs and symptoms indicating need for more acute intervention. Patient verbalized understanding. After Visit Summary given.         Rennis Harding,  PA-C 08/30/19 1531

## 2019-08-30 NOTE — Discharge Instructions (Signed)
Strep negative.  Culture sent.  WE will follow up with you regarding abnormal results.   COVID testing ordered.  It will take between 5-7 days for test results.  Someone will contact you regarding abnormal results.    In the meantime: You should remain isolated in your home for 10 days from symptom onset AND greater than 72 hours after symptoms resolution (absence of fever without the use of fever-reducing medication and improvement in respiratory symptoms), whichever is longer Get plenty of rest and push fluids Tessalon Perles prescribed for cough Use OTC zyrtec for nasal congestion, runny nose, and/or sore throat Use OTC flonase for nasal congestion and runny nose Use medications daily for symptom relief Use OTC medications like ibuprofen or tylenol as needed fever or pain Call or go to the ED if you have any new or worsening symptoms such as fever, worsening cough, shortness of breath, chest tightness, chest pain, turning blue, changes in mental status, etc..Marland Kitchen

## 2019-08-31 LAB — SARS-COV-2, NAA 2 DAY TAT

## 2019-08-31 LAB — NOVEL CORONAVIRUS, NAA: SARS-CoV-2, NAA: NOT DETECTED

## 2019-09-02 LAB — CULTURE, GROUP A STREP (THRC): Special Requests: NORMAL

## 2019-09-10 ENCOUNTER — Other Ambulatory Visit: Payer: Self-pay

## 2019-09-10 ENCOUNTER — Other Ambulatory Visit: Payer: BC Managed Care – PPO

## 2019-09-10 DIAGNOSIS — Z20822 Contact with and (suspected) exposure to covid-19: Secondary | ICD-10-CM

## 2019-09-11 LAB — NOVEL CORONAVIRUS, NAA: SARS-CoV-2, NAA: NOT DETECTED

## 2019-09-11 LAB — SARS-COV-2, NAA 2 DAY TAT

## 2019-09-24 ENCOUNTER — Ambulatory Visit
Admission: EM | Admit: 2019-09-24 | Discharge: 2019-09-24 | Disposition: A | Discharge status: Home / Self Care | Payer: BC Managed Care – PPO | Attending: Emergency Medicine | Admitting: Emergency Medicine

## 2019-09-24 ENCOUNTER — Other Ambulatory Visit: Payer: Self-pay

## 2019-09-24 DIAGNOSIS — J069 Acute upper respiratory infection, unspecified: Secondary | ICD-10-CM

## 2019-09-24 DIAGNOSIS — Z1152 Encounter for screening for COVID-19: Secondary | ICD-10-CM | POA: Diagnosis not present

## 2019-09-24 LAB — POCT INFLUENZA A/B
Influenza A, POC: NEGATIVE
Influenza B, POC: NEGATIVE

## 2019-09-24 MED ORDER — CETIRIZINE HCL 10 MG PO TABS: 10.0000 mg | ORAL_TABLET | Freq: Every day | ORAL | 0 refills | Status: DC

## 2019-09-24 MED ORDER — PREDNISONE 10 MG PO TABS
20.0000 mg | ORAL_TABLET | Freq: Every day | ORAL | 0 refills | Status: DC
Start: 2019-09-24 — End: 2020-01-19

## 2019-09-24 MED ORDER — FLUTICASONE PROPIONATE 50 MCG/ACT NA SUSP
1.0000 | Freq: Every day | NASAL | 0 refills | Status: DC
Start: 2019-09-24 — End: 2020-01-19

## 2019-09-24 MED ORDER — BENZONATATE 100 MG PO CAPS
100.0000 mg | ORAL_CAPSULE | Freq: Three times a day (TID) | ORAL | 0 refills | Status: DC
Start: 2019-09-24 — End: 2020-01-19

## 2019-09-24 NOTE — Discharge Instructions (Addendum)
POCT influenza A/B test was negative  COVID testing ordered.  It will take between 2-7 days for test results.  Someone will contact you regarding abnormal results.    In the meantime: You should remain isolated in your home for 10 days from symptom onset AND greater than 24 hours after symptoms resolution (absence of fever without the use of fever-reducing medication and improvement in respiratory symptoms), whichever is longer Get plenty of rest and push fluids Tessalon Perles prescribed for cough Zyrtec for nasal congestion, runny nose, and/or sore throat Flonase for nasal congestion and runny nose Low-dose prednisone was prescribed Use medications daily for symptom relief Use OTC medications like ibuprofen or tylenol as needed fever or pain Call or go to the ED if you have any new or worsening symptoms such as fever, worsening cough, shortness of breath, chest tightness, chest pain, turning blue, changes in mental status, etc

## 2019-09-24 NOTE — ED Provider Notes (Addendum)
[facility]   [id number] 09/24/19 Arrival Time: 1200   CC: COVID symptoms  SUBJECTIVE: History from: patient and family.  Morgan Olson is a 29 y.o. female who presents for COVID testing after Covid exposure and nasal congestion for the past 4 days.  Denies sick exposure to flu or strep.  Denies recent travel.  Denies aggravating or alleviating symptoms.  Denies previous COVID infection.   Denies fever, chills, fatigue,  rhinorrhea, sore throat, cough, SOB, wheezing, chest pain, nausea, vomiting, changes in bowel or bladder habits.      ROS: As per HPI.  All other pertinent ROS negative.     Past Medical History:  Diagnosis Date  . Anxiety   . Asthma    brochitic  . Depression    hx of no meds   . History of hiatal hernia 07/08/2019   Past Surgical History:  Procedure Laterality Date  . DIAGNOSTIC LAPAROSCOPY    . GANGLION CYST EXCISION    . LAPAROSCOPIC GASTRIC SLEEVE RESECTION N/A 07/08/2018   Procedure: LAPAROSCOPIC GASTRIC SLEEVE RESECTION WITH HIATAL HERNIA REPAIR, UPPER ENDO, ERAS PATHWAY;  Surgeon: Gaynelle Adu, MD;  Location: [facility];  Service: General;  Laterality: N/A;   No Known Allergies No current facility-administered medications on file prior to encounter.   Current Outpatient Medications on File Prior to Encounter  Medication Sig Dispense Refill  . amoxicillin-clavulanate (AUGMENTIN) 875-125 MG tablet Take 1 tablet by mouth 2 (two) times daily.    Marland Kitchen dexamethasone (DECADRON) 1 MG tablet 2 tablets twice daily for 2 days, one tablet twice daily for 2 days, one tablet daily for 2 days. 15 tablet 0  . Dextromethorphan-guaiFENesin (MUCUS RELIEF DM PO) Take 1 tablet by mouth every 12 (twelve) hours as needed (mucus relief).    . diazepam (VALIUM) 5 MG tablet Take 1 tablet (5 mg total) by mouth every 6 (six) hours as needed for muscle spasms. 40 tablet 0  . diclofenac (VOLTAREN) 75 MG EC tablet Take 75 mg by mouth 2 (two) times daily.    Marland Kitchen docusate sodium  (COLACE) 100 MG capsule Take 100 mg by mouth 2 (two) times daily as needed for mild constipation.    . Lidocaine-Glycerin (PREPARATION H EX) Apply 1 application topically daily as needed (hemorrhoids).    . meloxicam (MOBIC) 15 MG tablet Take 1 tablet (15 mg total) by mouth daily. 30 tablet 0  . Multiple Vitamin (MULTIVITAMIN WITH MINERALS) TABS tablet Take 1 tablet by mouth daily.    Marland Kitchen oxyCODONE-acetaminophen (PERCOCET) 5-325 MG tablet Take 1-2 tablets by mouth every 4 (four) hours as needed for moderate pain or severe pain. 60 tablet 0  . sodium chloride (OCEAN) 0.65 % SOLN nasal spray Place 1 spray into both nostrils 2 (two) times daily as needed for congestion.    . SPRINTEC 28 0.25-35 MG-MCG tablet Take 1 tablet by mouth daily.     Social History   Socioeconomic History  . Marital status: Married    Spouse name: Not on file  . Number of children: Not on file  . Years of education: Not on file  . Highest education level: Not on file  Occupational History  . Not on file  Tobacco Use  . Smoking status: Never Smoker  . Smokeless tobacco: Never Used  Vaping Use  . Vaping Use: Never used  Substance and Sexual Activity  . Alcohol use: No    Comment: "very rarely"  . Drug use: No  . Sexual activity: Not Currently  Other Topics Concern  . Not on file  Social History Narrative  . Not on file   Social Determinants of Health   Financial Resource Strain:   . Difficulty of Paying Living Expenses: Not on file  Food Insecurity:   . Worried About Programme researcher, broadcasting/film/video in the Last Year: Not on file  . Ran Out of Food in the Last Year: Not on file  Transportation Needs:   . Lack of Transportation (Medical): Not on file  . Lack of Transportation (Non-Medical): Not on file  Physical Activity:   . Days of Exercise per Week: Not on file  . Minutes of Exercise per Session: Not on file  Stress:   . Feeling of Stress : Not on file  Social Connections:   . Frequency of Communication with  Friends and Family: Not on file  . Frequency of Social Gatherings with Friends and Family: Not on file  . Attends Religious Services: Not on file  . Active Member of Clubs or Organizations: Not on file  . Attends Banker Meetings: Not on file  . Marital Status: Not on file  Intimate Partner Violence:   . Fear of Current or Ex-Partner: Not on file  . Emotionally Abused: Not on file  . Physically Abused: Not on file  . Sexually Abused: Not on file   Family History  Problem Relation Age of Onset  . Diabetes Maternal Aunt   . Cancer Maternal Aunt   . Heart disease Maternal Grandmother   . Diabetes Maternal Grandmother   . Cancer Maternal Grandmother   . Hypertension Paternal Grandmother     OBJECTIVE:  Vitals:   09/24/19 1303  BP: 109/74  Pulse: 92  Resp: 16  Temp: 98.9 F (37.2 C)  TempSrc: Oral  SpO2: 98%     General appearance: alert; appears fatigued, but nontoxic; speaking in full sentences and tolerating own secretions HEENT: NCAT; Ears: EACs clear, TMs pearly gray; Eyes: PERRL.  EOM grossly intact. Sinuses: nontender; Nose: nares patent without rhinorrhea, Throat: oropharynx clear, tonsils non erythematous or enlarged, uvula midline  Neck: supple without LAD Lungs: unlabored respirations, symmetrical air entry; cough: mild; no respiratory distress; CTAB Heart: regular rate and rhythm.  Radial pulses 2+ symmetrical bilaterally Skin: warm and dry Psychological: alert and cooperative; normal mood and affect  LABS:  No results found for this or any previous visit (from the past 24 hour(s)).   ASSESSMENT & PLAN:  1. Encounter for screening for COVID-19   2. Viral URI     Meds ordered this encounter  Medications  . cetirizine (ZYRTEC ALLERGY) 10 MG tablet    Sig: Take 1 tablet (10 mg total) by mouth daily.    Dispense:  30 tablet    Refill:  0  . fluticasone (FLONASE) 50 MCG/ACT nasal spray    Sig: Place 1 spray into both nostrils daily for 14  days.    Dispense:  16 g    Refill:  0  . benzonatate (TESSALON) 100 MG capsule    Sig: Take 1 capsule (100 mg total) by mouth every 8 (eight) hours.    Dispense:  21 capsule    Refill:  0  . predniSONE (DELTASONE) 10 MG tablet    Sig: Take 2 tablets (20 mg total) by mouth daily.    Dispense:  15 tablet    Refill:  0    Discharge Instructions.    COVID testing ordered.  It will take between 2-7 days  for test results.  Someone will contact you regarding abnormal results.    In the meantime: You should remain isolated in your home for 10 days from symptom onset AND greater than 24 hours after symptoms resolution (absence of fever without the use of fever-reducing medication and improvement in respiratory symptoms), whichever is longer Get plenty of rest and push fluids Tessalon Perles prescribed for cough Zyrtec for nasal congestion, runny nose, and/or sore throat Flonase for nasal congestion and runny nose Low-dose prednisone was prescribed Use medications daily for symptom relief Use OTC medications like ibuprofen or tylenol as needed fever or pain Call or go to the ED if you have any new or worsening symptoms such as fever, worsening cough, shortness of breath, chest tightness, chest pain, turning blue, changes in mental status, etc...   Reviewed expectations re: course of current medical issues. Questions answered. Outlined signs and symptoms indicating need for more acute intervention. Patient verbalized understanding. After Visit Summary given.      Note: This document was prepared using Dragon voice recognition software and may include unintentional dictation errors.    Durward Parcel, FNP 09/24/19 1326    Durward Parcel, FNP 09/24/19 1355

## 2019-09-24 NOTE — ED Triage Notes (Signed)
Presents with nasal congestion after covid exposure

## 2019-09-26 LAB — NOVEL CORONAVIRUS, NAA: SARS-CoV-2, NAA: DETECTED — AB

## 2019-09-27 ENCOUNTER — Telehealth: Payer: Self-pay | Admitting: Family

## 2019-09-27 NOTE — Telephone Encounter (Signed)
Called to Discuss with patient about Covid symptoms and the use of the monoclonal antibody infusion for those with mild to moderate Covid symptoms and at a high risk of hospitalization.     Pt appears to qualify for this infusion due to co-morbid conditions and/or a member of an at-risk group in accordance with the FDA Emergency Use Authorization.   Ms. Collar was recently seen at [facility] with congestion and COVID exposure. Sx onset was 8/28. Risk factors include asthma and BMI >25. Continues to experience fatigue, headache and occasional cough. Discussed the risks and benefits of treatment with Regeneron and she would like to hold off for now. She did receive the first dose of vaccine about 1 week prior to becoming sick.  Information provided in MyChart.   Marcos Eke, NP 09/27/2019 11:55 AM

## 2020-01-12 ENCOUNTER — Emergency Department (HOSPITAL_COMMUNITY)
Admission: EM | Admit: 2020-01-12 | Discharge: 2020-01-12 | Disposition: A | Discharge status: Home / Self Care | Payer: BC Managed Care – PPO | Attending: Emergency Medicine | Admitting: Emergency Medicine

## 2020-01-12 ENCOUNTER — Emergency Department (HOSPITAL_COMMUNITY): Payer: BC Managed Care – PPO

## 2020-01-12 ENCOUNTER — Encounter (HOSPITAL_COMMUNITY): Payer: Self-pay | Admitting: Emergency Medicine

## 2020-01-12 ENCOUNTER — Other Ambulatory Visit: Payer: Self-pay

## 2020-01-12 DIAGNOSIS — S20312A Abrasion of left front wall of thorax, initial encounter: Secondary | ICD-10-CM | POA: Insufficient documentation

## 2020-01-12 DIAGNOSIS — M542 Cervicalgia: Secondary | ICD-10-CM | POA: Insufficient documentation

## 2020-01-12 DIAGNOSIS — R0789 Other chest pain: Secondary | ICD-10-CM | POA: Insufficient documentation

## 2020-01-12 DIAGNOSIS — S80811A Abrasion, right lower leg, initial encounter: Secondary | ICD-10-CM | POA: Diagnosis not present

## 2020-01-12 DIAGNOSIS — S301XXA Contusion of abdominal wall, initial encounter: Secondary | ICD-10-CM | POA: Diagnosis not present

## 2020-01-12 DIAGNOSIS — T1490XA Injury, unspecified, initial encounter: Secondary | ICD-10-CM

## 2020-01-12 DIAGNOSIS — M25571 Pain in right ankle and joints of right foot: Secondary | ICD-10-CM | POA: Insufficient documentation

## 2020-01-12 DIAGNOSIS — J45909 Unspecified asthma, uncomplicated: Secondary | ICD-10-CM | POA: Diagnosis not present

## 2020-01-12 DIAGNOSIS — R52 Pain, unspecified: Secondary | ICD-10-CM

## 2020-01-12 LAB — I-STAT BETA HCG BLOOD, ED (MC, WL, AP ONLY): I-stat hCG, quantitative: <5 m[IU]/mL (ref <5)

## 2020-01-12 LAB — SAMPLE TO BLOOD BANK

## 2020-01-12 LAB — URINALYSIS, ROUTINE W REFLEX MICROSCOPIC
Bilirubin Urine: NEGATIVE
Glucose, UA: NEGATIVE mg/dL
Hgb urine dipstick: NEGATIVE
Ketones, ur: NEGATIVE mg/dL
Leukocytes,Ua: NEGATIVE
Nitrite: NEGATIVE
Protein, ur: NEGATIVE mg/dL
Specific Gravity, Urine: 1.006 (ref 1.005–1.030)
pH: 5 (ref 5.0–8.0)

## 2020-01-12 LAB — CBC
HCT: 41.9 % (ref 36.0–46.0)
Hemoglobin: 14.4 g/dL (ref 12.0–15.0)
MCH: 29 pg (ref 26.0–34.0)
MCHC: 34.4 g/dL (ref 30.0–36.0)
MCV: 84.3 fL (ref 80.0–100.0)
Platelets: 265 10*3/uL (ref 150–400)
RBC: 4.97 MIL/uL (ref 3.87–5.11)
RDW: 12.9 % (ref 11.5–15.5)
WBC: 6.1 10*3/uL (ref 4.0–10.5)
nRBC: 0 % (ref 0.0–0.2)

## 2020-01-12 LAB — COMPREHENSIVE METABOLIC PANEL
ALT: 23 U/L (ref 0–44)
AST: 28 U/L (ref 15–41)
Albumin: 3.7 g/dL (ref 3.5–5.0)
Alkaline Phosphatase: 66 U/L (ref 38–126)
Anion gap: 14 (ref 5–15)
BUN: 15 mg/dL (ref 6–20)
CO2: 21 mmol/L — ABNORMAL LOW (ref 22–32)
Calcium: 9 mg/dL (ref 8.9–10.3)
Chloride: 101 mmol/L (ref 98–111)
Creatinine, Ser: 0.88 mg/dL (ref 0.44–1.00)
GFR, Estimated: >60 mL/min (ref >60)
Glucose, Bld: 87 mg/dL (ref 70–99)
Potassium: 3.5 mmol/L (ref 3.5–5.1)
Sodium: 136 mmol/L (ref 135–145)
Total Bilirubin: 0.4 mg/dL (ref 0.3–1.2)
Total Protein: 6.9 g/dL (ref 6.5–8.1)

## 2020-01-12 LAB — LACTIC ACID, PLASMA: Lactic Acid, Venous: 1.7 mmol/L (ref 0.5–1.9)

## 2020-01-12 LAB — I-STAT CHEM 8, ED
BUN: 15 mg/dL (ref 6–20)
Calcium, Ion: 1.09 mmol/L — ABNORMAL LOW (ref 1.15–1.40)
Chloride: 103 mmol/L (ref 98–111)
Creatinine, Ser: 0.7 mg/dL (ref 0.44–1.00)
Glucose, Bld: 86 mg/dL (ref 70–99)
HCT: 43 % (ref 36.0–46.0)
Hemoglobin: 14.6 g/dL (ref 12.0–15.0)
Potassium: 3.4 mmol/L — ABNORMAL LOW (ref 3.5–5.1)
Sodium: 138 mmol/L (ref 135–145)
TCO2: 23 mmol/L (ref 22–32)

## 2020-01-12 LAB — PROTIME-INR
INR: 0.9 (ref 0.8–1.2)
Prothrombin Time: 12.1 seconds (ref 11.4–15.2)

## 2020-01-12 LAB — ETHANOL: Alcohol, Ethyl (B): <10 mg/dL (ref <10)

## 2020-01-12 MED ORDER — KETOROLAC TROMETHAMINE 30 MG/ML IJ SOLN
30.0000 mg | Freq: Once | INTRAMUSCULAR | Status: AC
  Administered 2020-01-12: 30 mg via INTRAVENOUS
  Filled 2020-01-12: qty 1

## 2020-01-12 MED ORDER — METHOCARBAMOL 750 MG PO TABS: 750.0000 mg | ORAL_TABLET | Freq: Every evening | ORAL | 0 refills | Status: AC | PRN

## 2020-01-12 MED ORDER — NAPROXEN 375 MG PO TABS: 375.0000 mg | ORAL_TABLET | Freq: Two times a day (BID) | ORAL | 0 refills | Status: AC

## 2020-01-12 MED ORDER — ACETAMINOPHEN 325 MG PO TABS
650.0000 mg | ORAL_TABLET | Freq: Once | ORAL | Status: AC
  Administered 2020-01-12: 23:00:00 650 mg via ORAL
  Filled 2020-01-12: qty 2

## 2020-01-12 MED ORDER — SODIUM CHLORIDE 0.9 % IV BOLUS
1000.0000 mL | Freq: Once | INTRAVENOUS | Status: AC
  Administered 2020-01-12: 20:00:00 1000 mL via INTRAVENOUS

## 2020-01-12 MED ORDER — IOHEXOL 300 MG/ML  SOLN
100.0000 mL | Freq: Once | INTRAMUSCULAR | Status: AC | PRN
  Administered 2020-01-12: 21:00:00 100 mL via INTRAVENOUS

## 2020-01-12 MED ORDER — FENTANYL CITRATE (PF) 100 MCG/2ML IJ SOLN
50.0000 ug | Freq: Once | INTRAMUSCULAR | Status: AC
Start: 2020-01-12 — End: 2020-01-12
  Administered 2020-01-12: 20:00:00 50 ug via INTRAVENOUS
  Filled 2020-01-12: qty 2
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
## 2020-01-12 NOTE — ED Notes (Signed)
Pt complaining of worsening HA.  PA notified and CT notified that preganncy is negative.

## 2020-01-12 NOTE — ED Notes (Signed)
DC instructions reviewed with pt.  PT verballized understanding.  Pt Dc

## 2020-01-12 NOTE — Discharge Instructions (Signed)
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
## 2020-01-12 NOTE — ED Notes (Signed)
Pt complaining of vision changes with black spots and a smoky field of vision and a bad HA.  EDP notified and CT called.  Awaiting pregnancy test

## 2020-01-13 NOTE — ED Notes (Signed)
I forgot to waste of fentanyl before DC this pt.  Yasmeen Patterson-RN visualized my waste.

## 2020-01-19 ENCOUNTER — Ambulatory Visit
Admission: EM | Admit: 2020-01-19 | Discharge: 2020-01-19 | Disposition: A | Discharge status: Home / Self Care | Payer: BC Managed Care – PPO | Attending: Internal Medicine | Admitting: Internal Medicine

## 2020-01-19 DIAGNOSIS — R0789 Other chest pain: Secondary | ICD-10-CM | POA: Diagnosis not present

## 2020-01-19 DIAGNOSIS — Z1152 Encounter for screening for COVID-19: Secondary | ICD-10-CM

## 2020-01-19 DIAGNOSIS — B349 Viral infection, unspecified: Secondary | ICD-10-CM

## 2020-01-19 MED ORDER — IBUPROFEN 600 MG PO TABS: 600.0000 mg | ORAL_TABLET | Freq: Four times a day (QID) | ORAL | 0 refills | Status: AC | PRN

## 2020-01-19 NOTE — Discharge Instructions (Signed)
Please take medications as directed Gentle range of motion exercises will be helpful Please quarantine until COVID-19 test results are available Lung sounds clear
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
## 2020-01-20 LAB — COVID-19, FLU A+B NAA
Influenza A, NAA: NOT DETECTED
Influenza B, NAA: NOT DETECTED
SARS-CoV-2, NAA: NOT DETECTED
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
## 2020-02-03 ENCOUNTER — Encounter (HOSPITAL_COMMUNITY): Payer: Self-pay

## 2021-01-23 NOTE — L&D Delivery Note (Signed)
Delivery Note ?At 7:04 AM a viable female was delivered via Vaginal, Spontaneous (Presentation: Left Occiput Anterior).  APGAR: 8, 9; weight pending.  Placenta status: Spontaneous, Intact.  Cord: 3 vessels with the following complications: None.  Cord pH: n/a ? ?Anesthesia: None ?Episiotomy: None ?Lacerations: right vaginal ?Suture Repair: 3.0 vicryl rapide ?Est. Blood Loss (mL):  200 ? ?Mom to postpartum.  Baby to Couplet care / Skin to Skin. ? ?Aundra Millet Keysha Damewood ?05/14/2021, 7:30 AM ? ? ? ?

## 2021-01-28 ENCOUNTER — Encounter (HOSPITAL_COMMUNITY): Payer: Self-pay | Admitting: *Deleted
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
## 2021-05-14 ENCOUNTER — Inpatient Hospital Stay (HOSPITAL_COMMUNITY)
Admission: AD | Admit: 2021-05-14 | Discharge: 2021-05-16 | DRG: 807 | Disposition: A | Discharge status: Home / Self Care | Payer: BC Managed Care – PPO | Attending: Obstetrics & Gynecology | Admitting: Obstetrics & Gynecology

## 2021-05-14 ENCOUNTER — Encounter (HOSPITAL_COMMUNITY): Payer: Self-pay | Admitting: Anesthesiology

## 2021-05-14 ENCOUNTER — Encounter (HOSPITAL_COMMUNITY): Payer: Self-pay | Admitting: *Deleted

## 2021-05-14 DIAGNOSIS — Z981 Arthrodesis status: Secondary | ICD-10-CM

## 2021-05-14 DIAGNOSIS — Z3A38 38 weeks gestation of pregnancy: Secondary | ICD-10-CM | POA: Diagnosis not present

## 2021-05-14 DIAGNOSIS — O99824 Streptococcus B carrier state complicating childbirth: Secondary | ICD-10-CM | POA: Diagnosis present

## 2021-05-14 DIAGNOSIS — O26893 Other specified pregnancy related conditions, third trimester: Secondary | ICD-10-CM | POA: Diagnosis present

## 2021-05-14 DIAGNOSIS — Z3490 Encounter for supervision of normal pregnancy, unspecified, unspecified trimester: Secondary | ICD-10-CM

## 2021-05-14 LAB — RPR: RPR Ser Ql: NONREACTIVE

## 2021-05-14 LAB — TYPE AND SCREEN
ABO/RH(D): A POS
Antibody Screen: NEGATIVE

## 2021-05-14 LAB — CBC
HCT: 34.3 % — ABNORMAL LOW (ref 36.0–46.0)
Hemoglobin: 10.9 g/dL — ABNORMAL LOW (ref 12.0–15.0)
MCH: 25.2 pg — ABNORMAL LOW (ref 26.0–34.0)
MCHC: 31.8 g/dL (ref 30.0–36.0)
MCV: 79.4 fL — ABNORMAL LOW (ref 80.0–100.0)
Platelets: 218 10*3/uL (ref 150–400)
RBC: 4.32 MIL/uL (ref 3.87–5.11)
RDW: 14.4 % (ref 11.5–15.5)
WBC: 9.7 10*3/uL (ref 4.0–10.5)
nRBC: 0 % (ref 0.0–0.2)

## 2021-05-14 MED ORDER — OXYCODONE-ACETAMINOPHEN 5-325 MG PO TABS: 2.0000 | ORAL_TABLET | ORAL | Status: DC | PRN

## 2021-05-14 MED ORDER — PRENATAL MULTIVITAMIN CH
1.0000 | ORAL_TABLET | Freq: Every day | ORAL | Status: DC
  Administered 2021-05-14 – 2021-05-15 (×2): 1 via ORAL
  Filled 2021-05-14 (×2): qty 1

## 2021-05-14 MED ORDER — ONDANSETRON HCL 4 MG PO TABS: 4.0000 mg | ORAL_TABLET | ORAL | Status: DC | PRN

## 2021-05-14 MED ORDER — LACTATED RINGERS IV SOLN: INTRAVENOUS | Status: DC

## 2021-05-14 MED ORDER — DIPHENHYDRAMINE HCL 25 MG PO CAPS
25.0000 mg | ORAL_CAPSULE | Freq: Four times a day (QID) | ORAL | Status: DC | PRN
Start: 2021-05-14 — End: 2021-05-16

## 2021-05-14 MED ORDER — SIMETHICONE 80 MG PO CHEW: 80.0000 mg | CHEWABLE_TABLET | ORAL | Status: DC | PRN

## 2021-05-14 MED ORDER — ACETAMINOPHEN 325 MG PO TABS: 650.0000 mg | ORAL_TABLET | ORAL | Status: DC | PRN

## 2021-05-14 MED ORDER — LIDOCAINE HCL (PF) 1 % IJ SOLN
30.0000 mL | INTRAMUSCULAR | Status: AC | PRN
  Administered 2021-05-14: 30 mL via SUBCUTANEOUS
  Filled 2021-05-14: qty 30

## 2021-05-14 MED ORDER — BENZOCAINE-MENTHOL 20-0.5 % EX AERO
1.0000 "application " | INHALATION_SPRAY | CUTANEOUS | Status: DC | PRN
  Administered 2021-05-14: 1 via TOPICAL
  Filled 2021-05-14: qty 56

## 2021-05-14 MED ORDER — LACTATED RINGERS IV SOLN: 500.0000 mL | INTRAVENOUS | Status: DC | PRN

## 2021-05-14 MED ORDER — OXYTOCIN BOLUS FROM INFUSION
333.0000 mL | Freq: Once | INTRAVENOUS | Status: AC
  Administered 2021-05-14: 333 mL via INTRAVENOUS

## 2021-05-14 MED ORDER — OXYTOCIN-SODIUM CHLORIDE 30-0.9 UT/500ML-% IV SOLN
2.5000 [IU]/h | INTRAVENOUS | Status: DC
  Filled 2021-05-14: qty 500

## 2021-05-14 MED ORDER — COCONUT OIL OIL
1.0000 "application " | TOPICAL_OIL | Status: DC | PRN
  Administered 2021-05-14: 1 via TOPICAL

## 2021-05-14 MED ORDER — SENNOSIDES-DOCUSATE SODIUM 8.6-50 MG PO TABS
2.0000 | ORAL_TABLET | Freq: Every day | ORAL | Status: DC
  Administered 2021-05-15: 2 via ORAL
  Filled 2021-05-14: qty 2

## 2021-05-14 MED ORDER — OXYCODONE-ACETAMINOPHEN 5-325 MG PO TABS: 1.0000 | ORAL_TABLET | ORAL | Status: DC | PRN

## 2021-05-14 MED ORDER — SODIUM CHLORIDE 0.9 % IV SOLN: 1.0000 g | INTRAVENOUS | Status: DC

## 2021-05-14 MED ORDER — FLEET ENEMA 7-19 GM/118ML RE ENEM: 1.0000 | ENEMA | RECTAL | Status: DC | PRN

## 2021-05-14 MED ORDER — ONDANSETRON HCL 4 MG/2ML IJ SOLN: 4.0000 mg | Freq: Four times a day (QID) | INTRAMUSCULAR | Status: DC | PRN

## 2021-05-14 MED ORDER — IBUPROFEN 600 MG PO TABS
600.0000 mg | ORAL_TABLET | Freq: Four times a day (QID) | ORAL | Status: DC
  Administered 2021-05-14 – 2021-05-16 (×7): 600 mg via ORAL
  Filled 2021-05-14 (×9): qty 1

## 2021-05-14 MED ORDER — ONDANSETRON HCL 4 MG/2ML IJ SOLN: 4.0000 mg | INTRAMUSCULAR | Status: DC | PRN

## 2021-05-14 MED ORDER — FERROUS SULFATE 325 (65 FE) MG PO TABS
325.0000 mg | ORAL_TABLET | Freq: Every day | ORAL | Status: DC
  Administered 2021-05-15: 325 mg via ORAL
  Filled 2021-05-14: qty 1

## 2021-05-14 MED ORDER — SOD CITRATE-CITRIC ACID 500-334 MG/5ML PO SOLN: 30.0000 mL | ORAL | Status: DC | PRN

## 2021-05-14 MED ORDER — WITCH HAZEL-GLYCERIN EX PADS: 1.0000 "application " | MEDICATED_PAD | CUTANEOUS | Status: DC | PRN

## 2021-05-14 MED ORDER — TETANUS-DIPHTH-ACELL PERTUSSIS 5-2.5-18.5 LF-MCG/0.5 IM SUSY: 0.5000 mL | PREFILLED_SYRINGE | Freq: Once | INTRAMUSCULAR | Status: DC

## 2021-05-14 MED ORDER — ZOLPIDEM TARTRATE 5 MG PO TABS: 5.0000 mg | ORAL_TABLET | Freq: Every evening | ORAL | Status: DC | PRN

## 2021-05-14 MED ORDER — SODIUM CHLORIDE 0.9 % IV SOLN
2.0000 g | Freq: Once | INTRAVENOUS | Status: AC
  Administered 2021-05-14: 2 g via INTRAVENOUS
  Filled 2021-05-14: qty 2000

## 2021-05-14 MED ORDER — DIBUCAINE (PERIANAL) 1 % EX OINT: 1.0000 "application " | TOPICAL_OINTMENT | CUTANEOUS | Status: DC | PRN

## 2021-05-14 NOTE — Lactation Note (Signed)
This note was copied from a baby's chart. ?Lactation Consultation Note ? ?Patient Name: Morgan Olson ?Today's Date: 05/14/2021 ?Reason for consult: Initial assessment;Early term 37-38.6wks;Nipple pain/trauma;Other (Comment) (History of Gastric Sleeve surgery 2020) ?Age:30 hours ? ?LC in to visit with P2 Mom of ET infant.  Baby dressed and sleeping loosely swaddled in crib.  Mom reports that baby is latching, but she feels latch is too shallow.  Nipples slightly pink in color.   ? ?Mom resting in bed, baby last fed 1 1/2-2 hrs ago.  Offered to undress baby and place baby STS to stimulate baby to cue.  Mom declined saying her daughter and family are about to arrive.  Mom will call for assistance with latch after the visit.  ? ?LC encouraged Mom to place baby prone on her chest for STS after visitors leave or sooner, and call for assistance when baby starts cueing to feed.  Mom agreeable. ? ?Maternal Data ?Has patient been taught Hand Expression?: Yes ?Does the patient have breastfeeding experience prior to this delivery?: Yes ?How long did the patient breastfeed?: a week ? ?Feeding ?Mother's Current Feeding Choice: Breast Milk ? ? Interventions ?Interventions: Breast feeding basics reviewed;Skin to skin;Breast massage;Hand express;LC Services brochure ? ?Discharge ?Pump: Personal (Medela PIS) ? ?Consult Status ?Consult Status: Follow-up ?Date: 05/15/21 ?Follow-up type: In-patient ? ? ? ?Johny Blamer E ?05/14/2021, 4:00 PM ? ? ? ?

## 2021-05-14 NOTE — H&P (Signed)
Morgan Olson is a 31 y.o. female G2P1001 at [redacted]w[redacted]d presenting for labor.  CTX started around MN.  No LOF or VB.  Active FM.  Since last delivery, patient had sleeve gastrectomy in 06/2018.  Last u/s on 4/11 showed EFW 6#14 (56%).  Also, patient had spinal fusion (L5-S1) in 2021.  Planning no CLEA with this delivery.  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  GBS positive. ? ?OB History   ? ? Gravida  ?2  ? Para  ?1  ? Term  ?1  ? Preterm  ?   ? AB  ?   ? Living  ?1  ?  ? ? SAB  ?   ? IAB  ?   ? Ectopic  ?   ? Multiple  ?0  ? Live Births  ?1  ?   ?  ?  ? ?Past Medical History:  ?Diagnosis Date  ? Anxiety   ? Asthma   ? brochitic  ? Depression   ? hx of no meds   ? History of hiatal hernia 07/08/2019  ? ?Past Surgical History:  ?Procedure Laterality Date  ? DIAGNOSTIC LAPAROSCOPY    ? GANGLION CYST EXCISION    ? LAPAROSCOPIC GASTRIC SLEEVE RESECTION N/A 07/08/2018  ? Procedure: LAPAROSCOPIC GASTRIC SLEEVE RESECTION WITH HIATAL HERNIA REPAIR, UPPER ENDO, ERAS PATHWAY;  Surgeon: Gaynelle Adu, MD;  Location: [facility];  Service: General;  Laterality: N/A;  ? ?Family History: family history includes Cancer in her maternal aunt and maternal grandmother; Diabetes in her maternal aunt and maternal grandmother; Heart disease in her maternal grandmother; Hypertension in her paternal grandmother. ?Social History:  reports that she has never smoked. She has never used smokeless tobacco. She reports that she does not drink alcohol and does not use drugs. ? ? ?  ?Maternal Diabetes: No ?Genetic Screening: Normal ?Maternal Ultrasounds/Referrals: Normal ?Fetal Ultrasounds or other Referrals:  None ?Maternal Substance Abuse:  No ?Significant Maternal Medications:  None ?Significant Maternal Lab Results:  Group B Strep positive ?Other Comments:  None ? ?Review of Systems ?Maternal Medical History:  ?Reason for admission: Contractions.  ? ?Contractions: Onset was 3-5 hours ago.   ?Frequency: regular.   ?Perceived severity is moderate.    ?Fetal activity: Perceived fetal activity is normal.   ?Last perceived fetal movement was within the past hour.   ?Prenatal complications: no prenatal complications ?Prenatal Complications - Diabetes: none. ? ?Dilation: 6 ?Effacement (%): 90 ?Station: -3 ?Exam by:: Santiago Bur, RN ?Blood pressure 128/79, pulse 91, temperature 99.1 ?F (37.3 ?C), temperature source Oral, resp. rate 20, height 5\' 2"  (1.575 m), weight 111.6 kg, unknown if currently breastfeeding. ?Maternal Exam:  ?Uterine Assessment: Contraction strength is moderate.  Contraction frequency is regular.  ?Abdomen: Patient reports no abdominal tenderness. Fundal height is c/w dates.   ?Estimated fetal weight is 7#8.   ? ? ?Fetal Exam ?Fetal Monitor Review: Baseline rate: 140.  ?Variability: moderate (6-25 bpm).   ?Pattern: accelerations present and no decelerations.   ?Fetal State Assessment: Category I - tracings are normal. ? ?Physical Exam ?Constitutional:   ?   Appearance: Normal appearance.  ?HENT:  ?   Head: Normocephalic and atraumatic.  ?Pulmonary:  ?   Effort: Pulmonary effort is normal.  ?Abdominal:  ?   Palpations: Abdomen is soft.  ?Musculoskeletal:     ?   General: Normal range of motion.  ?   Cervical back: Normal range of motion.  ?Skin: ?   General: Skin is warm  and dry.  ?Neurological:  ?   Mental Status: She is alert and oriented to person, place, and time.  ?Psychiatric:     ?   Mood and Affect: Mood normal.     ?   Behavior: Behavior normal.  ?  ?Prenatal labs: ?ABO, Rh: A pos ?Antibody: Negative ?Rubella:  Immune ?RPR:   NR ?HBsAg:   Negative ?HIV:   NR ?GBS:   Positive ? ?Assessment/Plan: ?30yo G2P1001 at [redacted]w[redacted]d with labor ?-Ampicillin for GBS pos and advance labor ?-Anesthesia consult given h/o spinal fusion ?-Anticipate NSVD  ? ?Aundra Millet Mashal Slavick ?05/14/2021, 5:45 AM ? ? ? ? ?

## 2021-05-14 NOTE — Lactation Note (Signed)
This note was copied from a baby's chart. ?Lactation Consultation Note ? ?Patient Name: Morgan Olson ?Today's Date: 05/14/2021 ?Reason for consult: Follow-up assessment;Early term 37-38.6wks ?Age:30 hours ? ? ?P2 mother whose infant is now 27 hours old.  This is an ETI at 38+5 weeks.  Mother's current feeding preference is breast. ? ?Mother requested latch assistance. ? ?Reviewed breast feeding basics with family.  Suggested mother remove sleeper and feed STS; mother agreeable.  Assisted to latch easily in the football hold to the left breast.  Observed "Morgan Olson"  feeding for 8 minutes before becoming sleepy.  Placed him STS and mother burped him; he began rooting and she latched independently to the right breast.  Observed him feeding for an additional 5 minutes before falling asleep.  Placed him STS and he remained sleeping.   ? ?Mother will call as needed for assistance.  Father at bedside; explained how father can help with gentle stimulation for "Morgan Olson" when he begins to get sleepy during feedings.  Parents receptive to all teaching.  RN updated and will bring coconut oil on med rounds. ? ? ?Maternal Data ?Has patient been taught Hand Expression?: Yes ?Does the patient have breastfeeding experience prior to this delivery?: Yes ?How long did the patient breastfeed?: 1 week ? ?Feeding ?Mother's Current Feeding Choice: Breast Milk ? ?LATCH Score ?Latch: Grasps breast easily, tongue down, lips flanged, rhythmical sucking. ? ?Audible Swallowing: None ? ?Type of Nipple: Everted at rest and after stimulation ? ?Comfort (Breast/Nipple): Soft / non-tender ? ?Hold (Positioning): Assistance needed to correctly position infant at breast and maintain latch. ? ?LATCH Score: 7 ? ? ?Lactation Tools Discussed/Used ?Tools: Coconut oil (RN will bring on med rounds) ? ?Interventions ?Interventions: Breast feeding basics reviewed;Assisted with latch;Skin to skin;Breast massage;Breast compression;Coconut oil;Position options;Support  pillows;Adjust position;Education ? ?Discharge ?Pump: Personal (Medela PIS) ? ?Consult Status ?Consult Status: Follow-up ?Date: 05/15/21 ?Follow-up type: In-patient ? ? ? ?Lanice Schwab Tramar Brueckner ?05/14/2021, 6:19 PM ? ? ? ?

## 2021-05-14 NOTE — Lactation Note (Signed)
This note was copied from a baby's chart. ?Lactation Consultation Note ? ?Patient Name: Morgan Olson ?Today's Date: 05/14/2021 ?Reason for consult: L&D Initial assessment;Early term 37-38.6wks;1st time breastfeeding ?Age:29 hours ? ? ?Initial L&D Consult: ? ?Visited with family < 1 hour after delivery. ?Assisted baby to latch easily and he eagerly began sucking.  Observed him for 5 minutes; mother denied pain.  Reassurance provided and informed parents that lactation services will be available on the M/B unit.  Allowed time for family bonding.  Father at bedside. ? ? ?Maternal Data ?  ? ?Feeding ?Mother's Current Feeding Choice: Breast Milk ? ?LATCH Score ?Latch: Repeated attempts needed to sustain latch, nipple held in mouth throughout feeding, stimulation needed to elicit sucking reflex. ? ?Audible Swallowing: None ? ?Type of Nipple: Everted at rest and after stimulation ? ?Comfort (Breast/Nipple): Soft / non-tender ? ?Hold (Positioning): Assistance needed to correctly position infant at breast and maintain latch. ? ?LATCH Score: 6 ? ? ?Lactation Tools Discussed/Used ?  ? ?Interventions ?Interventions: Skin to skin;Assisted with latch ? ?Discharge ?  ? ?Consult Status ?Consult Status: Follow-up from L&D ? ? ? ?Jaskirat Zertuche R Keigo Whalley ?05/14/2021, 7:58 AM ? ? ? ?

## 2021-05-14 NOTE — MAU Note (Signed)
PT SAYS  UC - STRONG SINCE MN ?[facility]- WITH DR MORRIS - ?VE 3 CM ON Thursday  ?DENIES HSV ?GBS- POSITIVE ? ?

## 2021-05-14 NOTE — Progress Notes (Signed)
Morgan Olson is a 31 y.o. G2P1001 at [redacted]w[redacted]d by ultrasound admitted for active labor ? ?Subjective: ?Patient is feeling rectal pressure.  She does not want CLEA and requests AROM to expedite delivery. ? ?Objective: ?BP 128/79   Pulse 91   Temp 99.1 ?F (37.3 ?C) (Oral)   Resp 20   Ht 5\' 2"  (1.575 m)   Wt 111.6 kg   BMI 44.99 kg/m?  ?No intake/output data recorded. ?No intake/output data recorded. ? ?FHT:  FHR: 140 bpm, variability: moderate,  accelerations:  Present,  decelerations:  Absent ?UC:   regular, every 2 minutes ?SVE:   Dilation: 9 ?Effacement (%): 90 ?Station: 0, -1 ?Exam by:: Morgan Salt, Morgan Olson ?AROM, clear ? ?Labs: ?Lab Results  ?Component Value Date  ? WBC 9.7 05/14/2021  ? HGB 10.9 (L) 05/14/2021  ? HCT 34.3 (L) 05/14/2021  ? MCV 79.4 (L) 05/14/2021  ? PLT 218 05/14/2021  ? ? ?Assessment / Plan: ?Spontaneous labor, progressing normally ?S/P AROM.  Prior to AROM, patient is counseled that given GBS pos (abx @ 0515) peds may not be amenable to d/c after 24 hours to observe newborn for infection given inadequate tx of GBS and increased risk for neonatal complications.  She expresses understanding and strongly desired AROM. ? ?Labor: Progressing normally ?Preeclampsia:   n/a ?Fetal Wellbeing:  Category I ?Pain Control:  Labor support without medications ?I/D:  n/a ?Anticipated MOD:  NSVD ? ?Morgan Olson ?05/14/2021, 6:46 AM ? ? ?

## 2021-05-15 ENCOUNTER — Encounter (HOSPITAL_COMMUNITY): Payer: Self-pay | Admitting: Obstetrics & Gynecology

## 2021-05-15 LAB — CBC
HCT: 29.7 % — ABNORMAL LOW (ref 36.0–46.0)
Hemoglobin: 9.5 g/dL — ABNORMAL LOW (ref 12.0–15.0)
MCH: 25.9 pg — ABNORMAL LOW (ref 26.0–34.0)
MCHC: 32 g/dL (ref 30.0–36.0)
MCV: 80.9 fL (ref 80.0–100.0)
Platelets: 197 10*3/uL (ref 150–400)
RBC: 3.67 MIL/uL — ABNORMAL LOW (ref 3.87–5.11)
RDW: 14.6 % (ref 11.5–15.5)
WBC: 8.4 10*3/uL (ref 4.0–10.5)
nRBC: 0 % (ref 0.0–0.2)

## 2021-05-15 MED ORDER — SUCROSE 24% NICU/PEDS ORAL SOLUTION: 0.5000 mL | OROMUCOSAL | Status: DC | PRN

## 2021-05-15 MED ORDER — ACETAMINOPHEN FOR CIRCUMCISION 160 MG/5 ML: 40.0000 mg | Freq: Once | ORAL | Status: DC

## 2021-05-15 MED ORDER — WHITE PETROLATUM EX OINT: 1.0000 "application " | TOPICAL_OINTMENT | CUTANEOUS | Status: DC | PRN

## 2021-05-15 MED ORDER — ACETAMINOPHEN FOR CIRCUMCISION 160 MG/5 ML: 40.0000 mg | ORAL | Status: DC | PRN

## 2021-05-15 MED ORDER — LIDOCAINE 1% INJECTION FOR CIRCUMCISION: 0.8000 mL | INJECTION | Freq: Once | INTRAVENOUS | Status: DC

## 2021-05-15 MED ORDER — EPINEPHRINE TOPICAL FOR CIRCUMCISION 0.1 MG/ML: 1.0000 [drp] | TOPICAL | Status: DC | PRN

## 2021-05-15 NOTE — Progress Notes (Signed)
Post Partum Day 1 ?Subjective: ?no complaints, up ad lib, voiding, and tolerating PO.  Desires circ. ? ?Objective: ?Blood pressure 126/78, pulse 73, temperature 97.9 ?F (36.6 ?C), temperature source Oral, resp. rate 18, height 5\' 2"  (1.575 m), weight 111.6 kg, SpO2 97 %, unknown if currently breastfeeding. ? ?Physical Exam:  ?General: alert, cooperative, and appears stated age ?Lochia: appropriate ?Uterine Fundus: firm ?Incision: healing well, no significant drainage, no dehiscence ?DVT Evaluation: No evidence of DVT seen on physical exam. ?Negative Homan's sign. ?No cords or calf tenderness. ? ?Recent Labs  ?  05/14/21 ?0436 05/15/21 ?05/17/21  ?HGB 10.9* 9.5*  ?HCT 34.3* 29.7*  ? ? ?Assessment/Plan: ?Plan for discharge tomorrow, Breastfeeding, and Circumcision prior to discharge ?Circ-patient is counseled re: risk of bleeding, infection, and scarring.  All questions were answered and patient wishes to proceed. ? ? LOS: 1 day  ? ?9163 Andry Bogden ?05/15/2021, 7:39 AM  ? ? ?

## 2021-05-15 NOTE — Lactation Note (Signed)
This note was copied from a baby's chart. ?Lactation Consultation Note ? ?Patient Name: Morgan Olson ?Today's Date: 05/15/2021 ?Reason for consult: Follow-up assessment;Early term 37-38.6wks ?Age:30 hours ? ?LC in to room for follow up. Per mother, infant has a difficult latch and nipples are very sore. Assisted with latch and noted infant transitions to a shallow latch very quickly. Demonstrated c-hold for breast as well as chin tug to improve depth.  ?Nipple care with expressed breast milk, air drying and coconut oil.  ?Discussed normal infant behavior, clusterfeeding, normal output.  ?Feeding plan:  ?1-Breastfeeding on demand or 8-12 times in 24h period ?2-Nipple care ?3-Encouraged maternal rest, hydration and food intake.  ? ?Contact LC as needed for feeds/support/concerns/questions. All questions answered at this time.    ? ?Maternal Data ?Has patient been taught Hand Expression?: Yes ?Does the patient have breastfeeding experience prior to this delivery?: Yes ?How long did the patient breastfeed?: <8 weeks ? ?Feeding ?Mother's Current Feeding Choice: Breast Milk ? ?LATCH Score ?Latch: Grasps breast easily, tongue down, lips flanged, rhythmical sucking. ? ?Audible Swallowing: Spontaneous and intermittent ? ?Type of Nipple: Everted at rest and after stimulation ? ?Comfort (Breast/Nipple): Filling, red/small blisters or bruises, mild/mod discomfort ? ?Hold (Positioning): Assistance needed to correctly position infant at breast and maintain latch. ? ?LATCH Score: 8 ? ?Interventions ?Interventions: Assisted with latch;Skin to skin;Hand express;Breast massage;Position options;Support pillows;Adjust position;Breast compression;Expressed milk;Education ? ?Discharge ?Discharge Education: Engorgement and breast care ?Pump: Personal ? ?Consult Status ?Consult Status: Follow-up ?Date: 05/16/21 ?Follow-up type: In-patient ? ? ? ?Gerren Hoffmeier A Higuera Ancidey ?05/15/2021, 5:45 PM ? ? ? ?

## 2021-05-15 NOTE — Clinical Social Work Maternal (Signed)
?  CLINICAL SOCIAL WORK MATERNAL/CHILD NOTE ? ?Patient Details  ?Name: Morgan Olson ?MRN: [id number] ?Date of Birth: April 01, 1990 ? ?Date:  05/15/2021 ? ?Clinical Social Worker Initiating Note:  Morgan Olson, [place] Date/Time: Initiated:  05/15/21/1234    ? ?Child's Name:  Morgan Olson  ? ?Biological Parents:  Mother, Father  ? ?Need for Interpreter:  None  ? ?Reason for Referral:  Other (Comment), Recent Intentional Overdose   (Hx of anxiety and depression)  ? ?Address:  [address] ?[place] [address]  ?  ?Phone number:  [phone] (home)    ? ?Additional phone number:  ? ?Household Members/Support Persons (HM/SP):   Household Member/Support Person 1, Household Member/Support Person 2 ? ? ?HM/SP Name Relationship DOB or Age  ?HM/SP -1 Morgan Olson husband    ?HM/SP -2 Morgan Olson 5  ?HM/SP -3        ?HM/SP -4        ?HM/SP -5        ?HM/SP -6        ?HM/SP -7        ?HM/SP -8        ? ? ?Natural Supports (not living in the home):  Community, Extended Family, Friends, Immediate Family  ? ?Professional Supports:    ? ?Employment: Full-time  ? ?Type of Work: banking  ? ?Education:  Some College  ? ?Homebound arranged:   ? ?Financial Resources:  Multimedia programmer   ? ?Other Resources:     ? ?Cultural/Religious Considerations Which May Impact Care:   ? ?Strengths:  Ability to meet basic needs  , Home prepared for child  , Pediatrician chosen  ? ?Psychotropic Medications:        ? ?Pediatrician:    [place] ? ?Pediatrician List:  ? ?[facility]  ?[place]    ?[facility]    ?[facility]    ?[facility]    ?[facility]    ? ? ?Pediatrician Fax Number:   ? ?Risk Factors/Current Problems:     ? ?Cognitive State:  Alert    ? ?Mood/Affect:  Calm    ? ?CSW Assessment: Csw met with MOB and FOB at bedside for hx of anxiety and depression.  ? ?CSW provided education regarding Baby Blues vs PMADs and provided MOB with resources for mental health follow up.   CSW encouraged MOB to evaluate her mental health throughout the postpartum period with the use of the New Mom Checklist developed by [facility] as well as the [place] Postnatal Depression Scale and notify a medical professional if symptoms arise.   ? ? ?CSW Plan/Description:  Sudden Infant Death Syndrome (SIDS) Education, Perinatal Mood and Anxiety Disorder (PMADs) Education, No Further Intervention Required/No Barriers to Discharge  ? ? ?Morgan Reeve, LCSW ?05/15/2021, 12:38 PM ? ?

## 2021-05-16 NOTE — Discharge Summary (Signed)
? ?  Postpartum Discharge Summary ? ?   ?Patient Name: Morgan Olson ?DOB: 1990-10-30 ?MRN: [id number] ? ?Date of admission: 05/14/2021 ?Delivery date:05/14/2021  ?Delivering provider: Linda Hedges  ?Date of discharge: 05/16/2021 ? ?Admitting diagnosis: Normal labor [O80, Z37.9] ?Pregnancy [Z34.90] ?Intrauterine pregnancy: [redacted]w[redacted]d    ?Secondary diagnosis:  Principal Problem: ?  Normal labor ?Active Problems: ?  Pregnancy ? ?Additional problems: None    ?Discharge diagnosis: Term Pregnancy Delivered                                              ?Post partum procedures: None ?Augmentation: AROM ?Complications: None ? ?Hospital course: Onset of Labor With Vaginal Delivery      ?31y.o. yo G2P2002 at 357w5das admitted in Active Labor on 05/14/2021. Patient had an uncomplicated labor course as follows:  ?Membrane Rupture Time/Date: 6:43 AM ,05/14/2021   ?Delivery Method:Vaginal, Spontaneous  ?Episiotomy: None  ?Lacerations:  Labial  ?Patient had an uncomplicated postpartum course.  She is ambulating, tolerating a regular diet, passing flatus, and urinating well. Patient is discharged home in stable condition on 05/16/21. ? ?Newborn Data: ?Birth date:05/14/2020  ?Birth time:7:04 AM  ?Gender:Female  ?Living status:Living  ?Apgars:8 ,9  ?WeATFTDD:2202  ? ?Magnesium Sulfate received: No ?BMZ received: No ?Rhophylac:N/A ?MMR:N/A ?T-DaP:Given prenatally ?Flu: N/A ?Transfusion:No ? ?Physical exam  ?Vitals:  ? 05/15/21 0510 05/15/21 1518 05/15/21 2132 05/16/21 [id number]?BP: 126/78 111/73 126/82 131/69  ?Pulse:  89 83 72  ?Resp: '18 18 18 18  ' ?Temp: 97.9 ?F (36.6 ?C) 98.5 ?F (36.9 ?C) 98 ?F (36.7 ?C) 98.6 ?F (37 ?C)  ?TempSrc: Oral Oral Oral Oral  ?SpO2: 97% 99% 98% 100%  ?Weight:      ?Height:      ? ?General: alert ?Lochia: appropriate ?Uterine Fundus: firm ?Incision: N/A ?DVT Evaluation: No evidence of DVT seen on physical exam. ?Labs: ?Lab Results  ?Component Value Date  ? WBC 8.4 05/15/2021  ? HGB 9.5 (L) 05/15/2021  ? HCT 29.7 (L)  05/15/2021  ? MCV 80.9 05/15/2021  ? PLT 197 05/15/2021  ? ? ?  Latest Ref Rng & Units 01/12/2020  ?  8:07 PM  ?CMP  ?Glucose 70 - 99 mg/dL 86    ?BUN 6 - 20 mg/dL 15    ?Creatinine 0.44 - 1.00 mg/dL 0.70    ?Sodium 135 - 145 mmol/L 138    ?Potassium 3.5 - 5.1 mmol/L 3.4    ?Chloride 98 - 111 mmol/L 103    ? ?Edinburgh Score: ? ?  05/15/2021  ?  8:10 AM  ?EdFlavia Shipperostnatal Depression Scale Screening Tool  ?I have been able to laugh and see the funny side of things. 0  ?I have looked forward with enjoyment to things. 0  ?I have blamed myself unnecessarily when things went wrong. 1  ?I have been anxious or worried for no good reason. 1  ?I have felt scared or panicky for no good reason. 2  ?Things have been getting on top of me. 1  ?I have been so unhappy that I have had difficulty sleeping. 0  ?I have felt sad or miserable. 0  ?I have been so unhappy that I have been crying. 0  ?The thought of harming myself has occurred to me. 0  ?Edinburgh Postnatal Depression Scale Total 5  ? ? ? ? ?After visit  meds:  ?Allergies as of 05/16/2021   ?No Known Allergies ?  ? ?  ?Medication List  ?  ? ?STOP taking these medications   ? ?amoxicillin 500 MG capsule ?Commonly known as: AMOXIL ?  ?Sprintec 28 0.25-35 MG-MCG tablet ?Generic drug: norgestimate-ethinyl estradiol ?  ? ?  ? ?TAKE these medications   ? ?ibuprofen 600 MG tablet ?Commonly known as: ADVIL ?Take 1 tablet (600 mg total) by mouth every 6 (six) hours as needed. ?  ?multivitamin-prenatal 27-0.8 MG Tabs tablet ?Take 1 tablet by mouth daily at 12 noon. ?  ? ?  ? ? ? ?Discharge home in stable condition ?Infant Feeding: Bottle and Breast ?Infant Disposition:home with mother ?Discharge instruction: per After Visit Summary and Postpartum booklet. ?Activity: Advance as tolerated. Pelvic rest for 6 weeks.  ?Diet: routine diet ?Anticipated Birth Control: Unsure ?Postpartum Appointment:6 weeks ?Additional Postpartum F/U:  None ?Future Appointments:No future appointments. ?Follow  up Visit: ? ? ?  ? ?05/16/2021 ?Tyson Dense, MD ? ? ?

## 2021-05-16 NOTE — Lactation Note (Signed)
This note was copied from a baby's chart. ?Lactation Consultation Note ? ?Patient Name: Morgan Olson ?Today's Date: 05/16/2021 ?Reason for consult: Follow-up assessment;Early term 37-38.6wks;Nipple pain/trauma;Infant weight loss;Breastfeeding assistance;Other (Comment) (LC assisted with latch on the right breast / noted areola edema, LC showed mom how to use the reverse pressure exercise and the latch was deeper and swallows noted .) ?Age:30 hours ?2nd Latch score 8 . Per mom the latch was  more comfortable.  ?LC reviewed BF D/C teaching and provided shells and a hand pump for D/C.  ?#21 Flange was a good fit and has #24 F and #27 F if needed.  ?LC recommended sore nipple tx - prior to latch - breast massage, hand express, pre pump if needed and reverse pressure ( as shown ) until soreness improves.  ?LC noted a small positional strip on the right nipple ( intact ) .  ?Mom aware of the [facility] resources after discharge.  ? ? ?Maternal Data ?Has patient been taught Hand Expression?: Yes ? ?Feeding ?Mother's Current Feeding Choice: Breast Milk ? ?LATCH Score ?Latch: Grasps breast easily, tongue down, lips flanged, rhythmical sucking. ? ?Audible Swallowing: Spontaneous and intermittent ? ?Type of Nipple: Everted at rest and after stimulation ? ?Comfort (Breast/Nipple): Filling, red/small blisters or bruises, mild/mod discomfort ? ?Hold (Positioning): Assistance needed to correctly position infant at breast and maintain latch. ? ?LATCH Score: 8 ? ? ?Lactation Tools Discussed/Used ?Tools: Shells;Pump;Flanges;Coconut oil ?Flange Size: 24;27 ?Breast pump type: Manual ?Pump Education: Milk Storage;Setup, frequency, and cleaning ? ?Interventions ?Interventions: Breast feeding basics reviewed;Skin to skin;Assisted with latch;Breast massage;Hand express;Pre-pump if needed;Reverse pressure;Breast compression;Adjust position;Support pillows;Hand pump;Education;LC Services brochure ? ?Discharge ?Discharge Education: Engorgement and  breast care;Warning signs for feeding baby ?Pump: Manual;DEBP;Personal ? ?Consult Status ?Consult Status: Complete ?Date: 05/16/21 ?Follow-up type: In-patient ? ? ? ?Morgan Olson ?05/16/2021, 9:55 AM ? ? ? ?

## 2021-05-18 ENCOUNTER — Inpatient Hospital Stay (HOSPITAL_COMMUNITY)
Admission: AD | Admit: 2021-05-18 | Payer: BC Managed Care – PPO | Source: Home / Self Care | Admitting: Obstetrics and Gynecology

## 2021-05-18 ENCOUNTER — Inpatient Hospital Stay (HOSPITAL_COMMUNITY): Payer: BC Managed Care – PPO

## 2021-05-24 ENCOUNTER — Telehealth (HOSPITAL_COMMUNITY): Payer: Self-pay | Admitting: *Deleted

## 2021-05-24 NOTE — Telephone Encounter (Signed)
Mom reports feeling good. No concerns about herself at this time. EPDS=6 [facility] score=5) ?Mom reports baby is doing well. Feeding, peeing, and pooping without difficulty. Safe sleep reviewed. Mom reports no concerns about baby at present. ? ?Duffy Rhody, RN 05-24-2021 at 10:23am ?

## 2022-02-03 ENCOUNTER — Encounter (HOSPITAL_COMMUNITY): Payer: Self-pay | Admitting: *Deleted

## 2023-02-08 ENCOUNTER — Encounter (HOSPITAL_COMMUNITY): Payer: Self-pay | Admitting: *Deleted
